# Patient Record
Sex: Male | Born: 1945
Health system: Southern US, Community
[De-identification: ages and names within clinical notes are randomized; demographics above are authoritative.]

## PROBLEM LIST (undated history)

## (undated) DIAGNOSIS — T7840XA Allergy, unspecified, initial encounter: Secondary | ICD-10-CM

## (undated) DIAGNOSIS — R32 Unspecified urinary incontinence: Secondary | ICD-10-CM

## (undated) DIAGNOSIS — E785 Hyperlipidemia, unspecified: Secondary | ICD-10-CM

## (undated) DIAGNOSIS — Z2821 Immunization not carried out because of patient refusal: Secondary | ICD-10-CM

## (undated) DIAGNOSIS — E119 Type 2 diabetes mellitus without complications: Secondary | ICD-10-CM

## (undated) HISTORY — DX: Immunization not carried out because of patient refusal: Z28.21

## (undated) HISTORY — DX: Hyperlipidemia, unspecified: E78.5

## (undated) HISTORY — DX: Type 2 diabetes mellitus without complications: E11.9

## (undated) HISTORY — DX: Unspecified urinary incontinence: R32

## (undated) HISTORY — DX: Allergy, unspecified, initial encounter: T78.40XA

---

## 1994-06-08 HISTORY — PX: HUMERUS FRACTURE SURGERY: SHX670

## 2005-05-08 ENCOUNTER — Ambulatory Visit (HOSPITAL_BASED_OUTPATIENT_CLINIC_OR_DEPARTMENT_OTHER): Admission: RE | Admit: 2005-05-08 | Discharge: 2005-05-08 | Payer: Self-pay | Admitting: Surgery

## 2005-05-08 ENCOUNTER — Ambulatory Visit (HOSPITAL_COMMUNITY): Admission: RE | Admit: 2005-05-08 | Discharge: 2005-05-08 | Payer: Self-pay | Admitting: Surgery

## 2013-03-09 HISTORY — PX: WRIST SURGERY: SHX841

## 2016-07-03 DIAGNOSIS — S0086XA Insect bite (nonvenomous) of other part of head, initial encounter: Secondary | ICD-10-CM | POA: Diagnosis not present

## 2016-07-03 DIAGNOSIS — S0006XA Insect bite (nonvenomous) of scalp, initial encounter: Secondary | ICD-10-CM | POA: Diagnosis not present

## 2016-07-03 DIAGNOSIS — L089 Local infection of the skin and subcutaneous tissue, unspecified: Secondary | ICD-10-CM | POA: Diagnosis not present

## 2016-07-03 DIAGNOSIS — S1096XA Insect bite of unspecified part of neck, initial encounter: Secondary | ICD-10-CM | POA: Diagnosis not present

## 2016-07-03 DIAGNOSIS — W57XXXA Bitten or stung by nonvenomous insect and other nonvenomous arthropods, initial encounter: Secondary | ICD-10-CM | POA: Diagnosis not present

## 2017-05-02 DIAGNOSIS — E782 Mixed hyperlipidemia: Secondary | ICD-10-CM | POA: Diagnosis not present

## 2017-05-02 DIAGNOSIS — R351 Nocturia: Secondary | ICD-10-CM | POA: Diagnosis not present

## 2017-05-02 DIAGNOSIS — E1165 Type 2 diabetes mellitus with hyperglycemia: Secondary | ICD-10-CM | POA: Diagnosis not present

## 2017-05-02 DIAGNOSIS — E118 Type 2 diabetes mellitus with unspecified complications: Secondary | ICD-10-CM | POA: Diagnosis not present

## 2017-05-02 DIAGNOSIS — R2689 Other abnormalities of gait and mobility: Secondary | ICD-10-CM | POA: Diagnosis not present

## 2017-05-16 DIAGNOSIS — IMO0001 Reserved for inherently not codable concepts without codable children: Secondary | ICD-10-CM | POA: Insufficient documentation

## 2017-05-16 DIAGNOSIS — E1165 Type 2 diabetes mellitus with hyperglycemia: Principal | ICD-10-CM

## 2017-06-19 DIAGNOSIS — R079 Chest pain, unspecified: Secondary | ICD-10-CM | POA: Diagnosis not present

## 2017-06-19 DIAGNOSIS — Z8669 Personal history of other diseases of the nervous system and sense organs: Secondary | ICD-10-CM | POA: Diagnosis not present

## 2017-06-19 DIAGNOSIS — E1165 Type 2 diabetes mellitus with hyperglycemia: Secondary | ICD-10-CM | POA: Diagnosis not present

## 2017-06-19 DIAGNOSIS — F0781 Postconcussional syndrome: Secondary | ICD-10-CM | POA: Diagnosis not present

## 2017-06-19 DIAGNOSIS — M542 Cervicalgia: Secondary | ICD-10-CM | POA: Diagnosis not present

## 2017-06-19 DIAGNOSIS — Z8739 Personal history of other diseases of the musculoskeletal system and connective tissue: Secondary | ICD-10-CM | POA: Diagnosis not present

## 2017-06-19 DIAGNOSIS — E782 Mixed hyperlipidemia: Secondary | ICD-10-CM | POA: Diagnosis not present

## 2017-06-19 DIAGNOSIS — L989 Disorder of the skin and subcutaneous tissue, unspecified: Secondary | ICD-10-CM | POA: Diagnosis not present

## 2017-06-21 DIAGNOSIS — Z8739 Personal history of other diseases of the musculoskeletal system and connective tissue: Secondary | ICD-10-CM | POA: Insufficient documentation

## 2017-06-21 DIAGNOSIS — M542 Cervicalgia: Secondary | ICD-10-CM | POA: Insufficient documentation

## 2017-06-21 DIAGNOSIS — E782 Mixed hyperlipidemia: Secondary | ICD-10-CM | POA: Insufficient documentation

## 2017-06-21 DIAGNOSIS — Z8669 Personal history of other diseases of the nervous system and sense organs: Secondary | ICD-10-CM | POA: Insufficient documentation

## 2017-06-21 DIAGNOSIS — F0781 Postconcussional syndrome: Secondary | ICD-10-CM | POA: Insufficient documentation

## 2018-04-03 ENCOUNTER — Encounter: Payer: Self-pay | Admitting: Family Medicine

## 2018-04-03 ENCOUNTER — Ambulatory Visit (INDEPENDENT_AMBULATORY_CARE_PROVIDER_SITE_OTHER): Payer: Medicare Other | Admitting: Family Medicine

## 2018-04-03 VITALS — BP 130/70 | HR 93 | Temp 98.0°F | Ht 72.0 in | Wt 217.8 lb

## 2018-04-03 DIAGNOSIS — S069X1S Unspecified intracranial injury with loss of consciousness of 30 minutes or less, sequela: Secondary | ICD-10-CM | POA: Diagnosis not present

## 2018-04-03 DIAGNOSIS — IMO0001 Reserved for inherently not codable concepts without codable children: Secondary | ICD-10-CM

## 2018-04-03 DIAGNOSIS — H9113 Presbycusis, bilateral: Secondary | ICD-10-CM

## 2018-04-03 DIAGNOSIS — E1165 Type 2 diabetes mellitus with hyperglycemia: Secondary | ICD-10-CM

## 2018-04-03 DIAGNOSIS — S069X9A Unspecified intracranial injury with loss of consciousness of unspecified duration, initial encounter: Secondary | ICD-10-CM | POA: Insufficient documentation

## 2018-04-03 DIAGNOSIS — Z2821 Immunization not carried out because of patient refusal: Secondary | ICD-10-CM | POA: Diagnosis not present

## 2018-04-03 DIAGNOSIS — E782 Mixed hyperlipidemia: Secondary | ICD-10-CM | POA: Diagnosis not present

## 2018-04-03 DIAGNOSIS — S069XAA Unspecified intracranial injury with loss of consciousness status unknown, initial encounter: Secondary | ICD-10-CM | POA: Insufficient documentation

## 2018-04-03 DIAGNOSIS — B353 Tinea pedis: Secondary | ICD-10-CM | POA: Diagnosis not present

## 2018-04-03 DIAGNOSIS — H9190 Unspecified hearing loss, unspecified ear: Secondary | ICD-10-CM | POA: Insufficient documentation

## 2018-04-03 HISTORY — DX: Immunization not carried out because of patient refusal: Z28.21

## 2018-04-03 LAB — POCT URINALYSIS DIPSTICK
Bilirubin, UA: NEGATIVE
LEUKOCYTES UA: NEGATIVE
Nitrite, UA: NEGATIVE
Protein, UA: 15
RBC UA: NEGATIVE
Spec Grav, UA: 1.025 (ref 1.010–1.025)
Urobilinogen, UA: 0.2 E.U./dL
pH, UA: 5.5 (ref 5.0–8.0)

## 2018-04-03 LAB — MICROALBUMIN / CREATININE URINE RATIO
Creatinine,U: 86.7 mg/dL
MICROALB UR: 6.1 mg/dL — AB (ref 0.0–1.9)
Microalb Creat Ratio: 7 mg/g (ref 0.0–30.0)

## 2018-04-03 LAB — CBC WITH DIFFERENTIAL/PLATELET
Basophils Absolute: 0.1 10*3/uL (ref 0.0–0.1)
Basophils Relative: 0.7 % (ref 0.0–3.0)
EOS PCT: 1.5 % (ref 0.0–5.0)
Eosinophils Absolute: 0.1 10*3/uL (ref 0.0–0.7)
HCT: 45.9 % (ref 39.0–52.0)
Hemoglobin: 15.6 g/dL (ref 13.0–17.0)
LYMPHS ABS: 2.2 10*3/uL (ref 0.7–4.0)
Lymphocytes Relative: 26.6 % (ref 12.0–46.0)
MCHC: 33.9 g/dL (ref 30.0–36.0)
MCV: 91.3 fl (ref 78.0–100.0)
MONO ABS: 0.7 10*3/uL (ref 0.1–1.0)
MONOS PCT: 8.2 % (ref 3.0–12.0)
NEUTROS PCT: 63 % (ref 43.0–77.0)
Neutro Abs: 5.2 10*3/uL (ref 1.4–7.7)
Platelets: 171 10*3/uL (ref 150.0–400.0)
RBC: 5.03 Mil/uL (ref 4.22–5.81)
RDW: 14.1 % (ref 11.5–15.5)
WBC: 8.3 10*3/uL (ref 4.0–10.5)

## 2018-04-03 LAB — COMPREHENSIVE METABOLIC PANEL
ALK PHOS: 97 U/L (ref 39–117)
ALT: 27 U/L (ref 0–53)
AST: 17 U/L (ref 0–37)
Albumin: 4.2 g/dL (ref 3.5–5.2)
BILIRUBIN TOTAL: 0.6 mg/dL (ref 0.2–1.2)
BUN: 20 mg/dL (ref 6–23)
CALCIUM: 9.7 mg/dL (ref 8.4–10.5)
CO2: 24 mEq/L (ref 19–32)
Chloride: 97 mEq/L (ref 96–112)
Creatinine, Ser: 0.97 mg/dL (ref 0.40–1.50)
GFR: 81.24 mL/min (ref >60.00)
GLUCOSE: 366 mg/dL — AB (ref 70–99)
POTASSIUM: 4.7 meq/L (ref 3.5–5.1)
Sodium: 136 mEq/L (ref 135–145)
TOTAL PROTEIN: 7.4 g/dL (ref 6.0–8.3)

## 2018-04-03 LAB — LIPID PANEL
Cholesterol: 262 mg/dL — ABNORMAL HIGH (ref 0–200)
HDL: 30.4 mg/dL — AB (ref >39.00)
NonHDL: 231.69
TRIGLYCERIDES: 330 mg/dL — AB (ref 0.0–149.0)
Total CHOL/HDL Ratio: 9
VLDL: 66 mg/dL — AB (ref 0.0–40.0)

## 2018-04-03 LAB — LDL CHOLESTEROL, DIRECT: Direct LDL: 164 mg/dL

## 2018-04-03 LAB — POCT GLYCOSYLATED HEMOGLOBIN (HGB A1C): HEMOGLOBIN A1C: 14.2

## 2018-04-03 LAB — TSH: TSH: 1.66 u[IU]/mL (ref 0.35–4.50)

## 2018-04-03 MED ORDER — ASPIRIN EC 81 MG PO TBEC: 81.0000 mg | DELAYED_RELEASE_TABLET | Freq: Every day | ORAL | Status: DC

## 2018-04-03 MED ORDER — ASPIRIN 325 MG PO TABS: 325.0000 mg | ORAL_TABLET | Freq: Every day | ORAL | 3 refills | Status: DC | PRN

## 2018-04-03 MED ORDER — SIMVASTATIN 20 MG PO TABS: 20.0000 mg | ORAL_TABLET | Freq: Every day | ORAL | 3 refills | Status: DC

## 2018-04-03 MED ORDER — LISINOPRIL 5 MG PO TABS: 5.0000 mg | ORAL_TABLET | Freq: Every day | ORAL | 3 refills | Status: DC

## 2018-04-03 MED ORDER — METFORMIN HCL 500 MG PO TABS: 1000.0000 mg | ORAL_TABLET | Freq: Two times a day (BID) | ORAL | 3 refills | Status: DC

## 2018-04-03 NOTE — Patient Instructions (Addendum)
Please return in 4-6 weeks for recheck. We will call you with your lab results and I will add an additional medication for you diabetes at that time.   It was a pleasure meeting you today! Thank you for choosing [place] to meet your healthcare needs! I truly look forward to working with you. If you have any questions or concerns, please send me a message via Mychart or call the office at [phone].   Diabetes Mellitus and Nutrition When you have diabetes (diabetes mellitus), it is very important to have healthy eating habits because your blood sugar (glucose) levels are greatly affected by what you eat and drink. Eating healthy foods in the appropriate amounts, at about the same times every day, can help you:  Control your blood glucose.  Lower your risk of heart disease.  Improve your blood pressure.  Reach or maintain a healthy weight.  Every person with diabetes is different, and each person has different needs for a meal plan. Your health care provider may recommend that you work with a diet and nutrition specialist (dietitian) to make a meal plan that is best for you. Your meal plan may vary depending on factors such as:  The calories you need.  The medicines you take.  Your weight.  Your blood glucose, blood pressure, and cholesterol levels.  Your activity level.  Other health conditions you have, such as heart or kidney disease.  How do carbohydrates affect me? Carbohydrates affect your blood glucose level more than any other type of food. Eating carbohydrates naturally increases the amount of glucose in your blood. Carbohydrate counting is a method for keeping track of how many carbohydrates you eat. Counting carbohydrates is important to keep your blood glucose at a healthy level, especially if you use insulin or take certain oral diabetes medicines. It is important to know how many carbohydrates you can safely have in each meal. This is different for every person. Your  dietitian can help you calculate how many carbohydrates you should have at each meal and for snack. Foods that contain carbohydrates include:  Bread, cereal, rice, pasta, and crackers.  Potatoes and corn.  Peas, beans, and lentils.  Milk and yogurt.  Fruit and juice.  Desserts, such as cakes, cookies, ice cream, and candy.  How does alcohol affect me? Alcohol can cause a sudden decrease in blood glucose (hypoglycemia), especially if you use insulin or take certain oral diabetes medicines. Hypoglycemia can be a life-threatening condition. Symptoms of hypoglycemia (sleepiness, dizziness, and confusion) are similar to symptoms of having too much alcohol. If your health care provider says that alcohol is safe for you, follow these guidelines:  Limit alcohol intake to no more than 1 drink per day for nonpregnant women and 2 drinks per day for men. One drink equals 12 oz of beer, 5 oz of wine, or 1 oz of hard liquor.  Do not drink on an empty stomach.  Keep yourself hydrated with water, diet soda, or unsweetened iced tea.  Keep in mind that regular soda, juice, and other mixers may contain a lot of sugar and must be counted as carbohydrates.  What are tips for following this plan? Reading food labels  Start by checking the serving size on the label. The amount of calories, carbohydrates, fats, and other nutrients listed on the label are based on one serving of the food. Many foods contain more than one serving per package.  Check the total grams (g) of carbohydrates in one serving. You can  calculate the number of servings of carbohydrates in one serving by dividing the total carbohydrates by 15. For example, if a food has 30 g of total carbohydrates, it would be equal to 2 servings of carbohydrates.  Check the number of grams (g) of saturated and trans fats in one serving. Choose foods that have low or no amount of these fats.  Check the number of milligrams (mg) of sodium in one  serving. Most people should limit total sodium intake to less than 2,300 mg per day.  Always check the nutrition information of foods labeled as "low-fat" or "nonfat". These foods may be higher in added sugar or refined carbohydrates and should be avoided.  Talk to your dietitian to identify your daily goals for nutrients listed on the label. Shopping  Avoid buying canned, premade, or processed foods. These foods tend to be high in fat, sodium, and added sugar.  Shop around the outside edge of the grocery store. This includes fresh fruits and vegetables, bulk grains, fresh meats, and fresh dairy. Cooking  Use low-heat cooking methods, such as baking, instead of high-heat cooking methods like deep frying.  Cook using healthy oils, such as olive, canola, or sunflower oil.  Avoid cooking with butter, cream, or high-fat meats. Meal planning  Eat meals and snacks regularly, preferably at the same times every day. Avoid going long periods of time without eating.  Eat foods high in fiber, such as fresh fruits, vegetables, beans, and whole grains. Talk to your dietitian about how many servings of carbohydrates you can eat at each meal.  Eat 4-6 ounces of lean protein each day, such as lean meat, chicken, fish, eggs, or tofu. 1 ounce is equal to 1 ounce of meat, chicken, or fish, 1 egg, or 1/4 cup of tofu.  Eat some foods each day that contain healthy fats, such as avocado, nuts, seeds, and fish. Lifestyle   Check your blood glucose regularly.  Exercise at least 30 minutes 5 or more days each week, or as told by your health care provider.  Take medicines as told by your health care provider.  Do not use any products that contain nicotine or tobacco, such as cigarettes and e-cigarettes. If you need help quitting, ask your health care provider.  Work with a Social worker or diabetes educator to identify strategies to manage stress and any emotional and social challenges. What are some  questions to ask my health care provider?  Do I need to meet with a diabetes educator?  Do I need to meet with a dietitian?  What number can I call if I have questions?  When are the best times to check my blood glucose? Where to find more information:  American Diabetes Association: diabetes.org/food-and-fitness/food  Academy of Nutrition and Dietetics: PokerClues.dk  [employer] of Diabetes and Digestive and Kidney Diseases (NIH): ContactWire.be Summary  A healthy meal plan will help you control your blood glucose and maintain a healthy lifestyle.  Working with a diet and nutrition specialist (dietitian) can help you make a meal plan that is best for you.  Keep in mind that carbohydrates and alcohol have immediate effects on your blood glucose levels. It is important to count carbohydrates and to use alcohol carefully. This information is not intended to replace advice given to you by your health care provider. Make sure you discuss any questions you have with your health care provider. Document Released: 08/22/2005 Document Revised: 12/30/2016 Document Reviewed: 12/30/2016 Elsevier Interactive Patient Education  [employer].  Type 2 Diabetes Mellitus, Self Care, Adult When you have type 2 diabetes (type 2 diabetes mellitus), you must keep your blood sugar (glucose) under control. You can do this with:  Nutrition.  Exercise.  Lifestyle changes.  Medicines or insulin, if needed.  Support from your doctors and others.  How do I manage my blood sugar?  Check your blood sugar level every day, as often as told.  Call your doctor if your blood sugar is above your goal numbers for 2 tests in a row.  Have your A1c (hemoglobin A1c) level checked at least two times a year. Have it checked more often if your doctor tells you to. Your doctor  will set treatment goals for you. Generally, you should have these blood sugar levels:  Before meals (preprandial): 80-130 mg/dL (4.4-7.2 mmol/L).  After meals (postprandial): lower than 180 mg/dL (10 mmol/L).  A1c level: less than 7%.  What do I need to know about high blood sugar? High blood sugar is called hyperglycemia. Know the signs of high blood sugar. Signs may include:  Feeling: ? Thirsty. ? Hungry. ? Very tired.  Needing to pee (urinate) more than usual.  Blurry vision.  What do I need to know about low blood sugar? Low blood sugar is called hypoglycemia. This is when blood sugar is at or below 70 mg/dL (3.9 mmol/L). Symptoms may include:  Feeling: ? Hungry. ? Worried or nervous (anxious). ? Sweaty and clammy. ? Confused. ? Dizzy. ? Sleepy. ? Sick to your stomach (nauseous).  Having: ? A fast heartbeat (palpitations). ? A headache. ? A change in your vision. ? Jerky movements that you cannot control (seizure). ? Nightmares. ? Tingling or no feeling (numbness) around the mouth, lips, or tongue.  Having trouble with: ? Talking. ? Paying attention (concentrating). ? Moving (coordination). ? Sleeping.  Shaking.  Passing out (fainting).  Getting upset easily (irritability).  Treating low blood sugar  To treat low blood sugar, eat or drink something sugary right away. If you can think clearly and swallow safely, follow the 15:15 rule:  Take 15 grams of a fast-acting carb (carbohydrate). Some fast-acting carbs are: ? 1 tube of glucose gel. ? 3 sugar tablets (glucose pills). ? 6-8 pieces of hard candy. ? 4 oz (120 mL) of fruit juice. ? 4 oz (120 mL) regular (not diet) soda.  Check your blood sugar 15 minutes after you take the carb.  If your blood sugar is still at or below 70 mg/dL (3.9 mmol/L), take 15 grams of a carb again.  If your blood sugar does not go above 70 mg/dL (3.9 mmol/L) after 3 tries, get help right away.  After your blood sugar  goes back to normal, eat a meal or a snack within 1 hour.  Treating very low blood sugar If your blood sugar is at or below 54 mg/dL (3 mmol/L), you have very low blood sugar (severe hypoglycemia). This is an emergency. Do not wait to see if the symptoms will go away. Get medical help right away. Call your local emergency services (911 in the [place] Do not drive yourself to the hospital. If you have very low blood sugar and you cannot eat or drink, you may need a glucagon shot (injection). A family member or friend should learn how to check your blood sugar and how to give you a glucagon shot. Ask your doctor if you need to have a glucagon shot kit at home. What else is important to manage my diabetes?  Medicine Follow these instructions about insulin and diabetes medicines:  Take them as told by your doctor.  Adjust them as told by your doctor.  Do not run out of them.  Having diabetes can raise your risk for other long-term conditions. These include heart or kidney disease. Your doctor may prescribe medicines to help prevent problems from diabetes. Food   Make healthy food choices. These include: ? Chicken, fish, egg whites, and beans. ? Oats, whole wheat, bulgur, brown rice, quinoa, and millet. ? Fresh fruits and vegetables. ? Low-fat dairy products. ? Nuts, avocado, olive oil, and canola oil.  Make a food plan with a specialist (dietitian).  Follow instructions from your doctor about what you cannot eat or drink.  Drink enough fluid to keep your pee (urine) clear or pale yellow.  Eat healthy snacks between healthy meals.  Keep track of carbs that you eat. Read food labels. Learn food serving sizes.  Follow your sick day plan when you cannot eat or drink normally. Make this plan with your doctor so it is ready to use. Activity  Exercise at least 3 times a week.  Do not go more than 2 days without exercising.  Talk with your doctor before you start a new exercise. Your  doctor may need to adjust your insulin, medicines, or food. Lifestyle   Do not use any tobacco products. These include cigarettes, chewing tobacco, and e-cigarettes.If you need help quitting, ask your doctor.  Ask your doctor how much alcohol is safe for you.  Learn to deal with stress. If you need help with this, ask your doctor. Body care  Stay up to date with your shots (immunizations).  Have your eyes and feet checked by a doctor as often as told.  Check your skin and feet every day. Check for cuts, bruises, redness, blisters, or sores.  Brush your teeth and gums two times a day.  Floss at least one time a day.  Go to the dentist least one time every 6 months.  Stay at a healthy weight. General instructions   Take over-the-counter and prescription medicines only as told by your doctor.  Share your diabetes care plan with: ? Your work or school. ? People you live with.  Check your pee (urine) for ketones: ? When you are sick. ? As told by your doctor.  Carry a card or wear jewelry that says that you have diabetes.  Ask your doctor: ? Do I need to meet with a diabetes educator? ? Where can I find a support group for people with diabetes?  Keep all follow-up visits as told by your doctor. This is important. Where to find more information: To learn more about diabetes, visit:  American Diabetes Association: www.diabetes.org  American Association of Diabetes Educators: www.diabeteseducator.org/patient-resources  This information is not intended to replace advice given to you by your health care provider. Make sure you discuss any questions you have with your health care provider. Document Released: 03/18/2016 Document Revised: 05/02/2016 Document Reviewed: 12/29/2015 Elsevier Interactive Patient Education  [employer].
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
## 2018-04-06 MED ORDER — EMPAGLIFLOZIN 10 MG PO TABS: 10.0000 mg | ORAL_TABLET | Freq: Every day | ORAL | 5 refills | Status: DC

## 2018-04-06 NOTE — Addendum Note (Signed)
Addended by: Billey Chang on: 04/06/2018 11:12 AM   Modules accepted: Orders

## 2018-04-06 NOTE — Progress Notes (Signed)
Please call patient: I have reviewed his/her lab results. Lab results show an uncontrolled cholesterol level as well as the diabetes that we discussed. We started medication for both at the office visit. Work on a diabetic diet, drinking plenty of water, and f/u with me as scheduled. 04/27/2018

## 2018-04-07 ENCOUNTER — Other Ambulatory Visit: Payer: Self-pay | Admitting: Emergency Medicine

## 2018-04-13 ENCOUNTER — Telehealth: Payer: Self-pay | Admitting: Family Medicine

## 2018-04-13 NOTE — Telephone Encounter (Signed)
Pt came in to state that the new medication prescribed to him to take in conjunction with the metformin is far to expensive for his fixed income. He was told by that pharmacy that the prescription would be $530 for a 30-day supply. Patient states that he cannot afford it when he lives on a $900-a-month fixed income. Would like to have something prescribed that is in the $8-10 range instead. Requested a call back to confirm.

## 2018-04-14 MED ORDER — GLIMEPIRIDE 4 MG PO TABS: 8.0000 mg | ORAL_TABLET | Freq: Every day | ORAL | 5 refills | Status: DC

## 2018-04-14 NOTE — Addendum Note (Signed)
Addended by: Billey Chang on: 04/14/2018 12:01 PM   Modules accepted: Orders

## 2018-04-14 NOTE — Telephone Encounter (Signed)
Called patient and let him know that the Glimepiride has been sent to his pharmacy.

## 2018-04-14 NOTE — Telephone Encounter (Signed)
Called patient and he stated that his Medicare covers Glimepiride and it is $4 per 30 day supply. Patient stated that he had been out of his medication for a period of time before he came in for his visit with [place]. Patient states that he is only taking the Metformin for now. Patient would like to take the Glimepiride since it is affordable.  Please advise.

## 2018-04-27 ENCOUNTER — Encounter: Payer: Self-pay | Admitting: Family Medicine

## 2018-04-27 ENCOUNTER — Other Ambulatory Visit: Payer: Self-pay

## 2018-04-27 ENCOUNTER — Ambulatory Visit (INDEPENDENT_AMBULATORY_CARE_PROVIDER_SITE_OTHER): Payer: Medicare Other | Admitting: Family Medicine

## 2018-04-27 VITALS — BP 132/80 | HR 102 | Temp 98.3°F | Ht 72.0 in | Wt 219.8 lb

## 2018-04-27 DIAGNOSIS — E782 Mixed hyperlipidemia: Secondary | ICD-10-CM

## 2018-04-27 DIAGNOSIS — E1165 Type 2 diabetes mellitus with hyperglycemia: Secondary | ICD-10-CM | POA: Diagnosis not present

## 2018-04-27 DIAGNOSIS — IMO0001 Reserved for inherently not codable concepts without codable children: Secondary | ICD-10-CM

## 2018-04-27 LAB — POCT CBG (FASTING - GLUCOSE)-MANUAL ENTRY: Glucose Fasting, POC: 201 mg/dL — AB (ref 70–99)

## 2018-04-27 MED ORDER — SITAGLIPTIN PHOSPHATE 100 MG PO TABS: 100.0000 mg | ORAL_TABLET | Freq: Every day | ORAL | 5 refills | Status: DC

## 2018-04-27 NOTE — Patient Instructions (Addendum)
Follow up in 6-8 weeks for Diabetes & Cholesterol  STOP the Glimepiride - after a few says if the sleepiness is better, start the new medication that was sent in today.    We will call you with information regarding your referral appointment for the Nutrition and Diabetes Management.    Call us if you have any questions or concerns.
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
## 2018-04-29 ENCOUNTER — Telehealth: Payer: Self-pay | Admitting: Family Medicine

## 2018-04-29 NOTE — Telephone Encounter (Signed)
Patient came in today to complain about price of Januvia precrisption. States that base price for 30 count 100mg  tabs is $510.69, even with GoodRx coupon it it over $400.  Patient is on a fixed income of $900 a month, wants medication that costs less. Requesting call back to confirm.

## 2018-04-30 MED ORDER — DAPAGLIFLOZIN PROPANEDIOL 10 MG PO TABS: 10.0000 mg | ORAL_TABLET | Freq: Every day | ORAL | 0 refills | Status: DC

## 2018-04-30 NOTE — Telephone Encounter (Signed)
Prescription sent in for Fargixa 10mg , Patient assistance paperwork started. Tried to call patient but VM is not set up yet. I will call  Back today.

## 2018-04-30 NOTE — Telephone Encounter (Signed)
Please start patient assistance program for diabetes medication: Order: farxiga 25 daily; we have coupons for free month to get him started.  Then apply for pt assitance.  Notify pt.  Thanks, Dr. Jonni Sanger

## 2018-04-30 NOTE — Telephone Encounter (Signed)
Per Jason Yoder Patient's phone is messed up, I left a message on his daughters voicemail to have patient contact us.  Doloris Hall,  LPN

## 2018-04-30 NOTE — Telephone Encounter (Signed)
Wilder Glade only comes in 5mg  or 10mg , which dose do you prefer?   Thanks   .Doloris Hall,  LPN

## 2018-04-30 NOTE — Telephone Encounter (Signed)
10 mg daily

## 2018-05-01 NOTE — Telephone Encounter (Signed)
LM on Daughter, Carrie's Voicemail today. Advised to call us back and ask to speak with Amilyah Nack

## 2018-05-01 NOTE — Telephone Encounter (Signed)
Patient came to office, picked up coupon for free month and Financial  form  assistance for [place]

## 2018-05-18 ENCOUNTER — Encounter: Payer: Self-pay | Admitting: Skilled Nursing Facility1

## 2018-05-18 ENCOUNTER — Encounter: Payer: Medicare Other | Attending: Family Medicine | Admitting: Skilled Nursing Facility1

## 2018-05-18 DIAGNOSIS — E119 Type 2 diabetes mellitus without complications: Secondary | ICD-10-CM

## 2018-05-18 DIAGNOSIS — Z713 Dietary counseling and surveillance: Secondary | ICD-10-CM | POA: Diagnosis not present

## 2018-05-18 NOTE — Patient Instructions (Signed)
-  Always bring your meter with you everywhere you go -Always Properly dispose of your needles:  -Discard in a hard plastic/metal container with a lid (something the needle can't puncture)  -Write Do Not Recycle on the outside of the container  -Example: A laundry detergent bottle -Never use the same needle more than once -Eat 2-3 carbohydrate choices for each meal and 1 for each snack -A meal: carbohydrates, protein, vegetable -A snack: A Fruit OR Vegetable AND Protein  -Try to be more active -Always pay attention to your body keeping watchful of possible low blood sugar (below 70) or high blood sugar (above 200)  -Check your feet every day looking for anything that was not there the day before   -check your blood sugar at least 1 time a day: either before you eat anything in the morning or 2 hours after a meal: aiming for 80-130 before eating the morning and under 180 2 hours after you have eaten

## 2018-05-18 NOTE — Progress Notes (Signed)
Diabetes Self-Management Education  Visit Type: First/Initial  05/18/2018  Jason Yoder, identified by name and date of birth, is a 72 y.o. male with a diagnosis of Diabetes: Type 2.   ASSESSMENT  Height 6' (1.829 m), weight 214 lb (97.1 kg). Body mass index is 29.02 kg/m.  Pt states his Wife passed away 3 years ago. Pt states he would like to weigh about 175 pounds. Pt arrived with his granddaughter. Pt is hard of hearing and requires glasses for print.   Diabetes Self-Management Education - 05/18/18 1405      Visit Information   Visit Type  First/Initial      Initial Visit   Diabetes Type  Type 2    Are you currently following a meal plan?  No    Are you taking your medications as prescribed?  Yes    Date Diagnosed  2014      Health Coping   How would you rate your overall health?  Fair      Psychosocial Assessment   Patient Belief/Attitude about Diabetes  Motivated to manage diabetes    Self-management support  Family    Other persons present  Family Member      Pre-Education Assessment   Patient understands the diabetes disease and treatment process.  Needs Instruction    Patient understands incorporating nutritional management into lifestyle.  Needs Instruction    Patient undertands incorporating physical activity into lifestyle.  Needs Instruction    Patient understands using medications safely.  Needs Instruction    Patient understands monitoring blood glucose, interpreting and using results  Needs Instruction    Patient understands prevention, detection, and treatment of acute complications.  Needs Instruction    Patient understands prevention, detection, and treatment of chronic complications.  Needs Instruction    Patient understands how to develop strategies to address psychosocial issues.  Needs Instruction    Patient understands how to develop strategies to promote health/change behavior.  Needs Instruction      Complications   Last HgB A1C per  patient/outside source  14.2 %    Have you had a dilated eye exam in the past 12 months?  No    Have you had a dental exam in the past 12 months?  No    Are you checking your feet?  Yes    How many days per week are you checking your feet?  7      Dietary Intake   Breakfast  scrambled eggs with low sodium [place] bacon or     Lunch  deli [place] with lettuce     Snack (afternoon)  fruit    Dinner  chickena nd trukey thighs with broccoli    Snack (evening)  strawberry and nutella      Exercise   Exercise Type  ADL's    How many days per week to you exercise?  0    How many minutes per day do you exercise?  0    Total minutes per week of exercise  0      Patient Education   Previous Diabetes Education  No    Disease state   Factors that contribute to the development of diabetes    Nutrition management   Role of diet in the treatment of diabetes and the relationship between the three main macronutrients and blood glucose level;Carbohydrate counting;Food label reading, portion sizes and measuring food.;Meal timing in regards to the patients' current diabetes medication.    Physical activity and exercise  Role of exercise on diabetes management, blood pressure control and cardiac health.;Identified with patient nutritional and/or medication changes necessary with exercise.;Helped patient identify appropriate exercises in relation to his/her diabetes, diabetes complications and other health issue.    Monitoring  Taught/evaluated SMBG meter.;Purpose and frequency of SMBG.;Yearly dilated eye exam;Daily foot exams;Identified appropriate SMBG and/or A1C goals.    Acute complications  Taught treatment of hypoglycemia - the 15 rule.;Discussed and identified patients' treatment of hyperglycemia.    Chronic complications  Assessed and discussed foot care and prevention of foot problems;Dental care    Psychosocial adjustment  Role of stress on diabetes      Individualized Goals (developed by patient)    Nutrition  General guidelines for healthy choices and portions discussed;Adjust meds/carbs with exercise as discussed    Physical Activity  Exercise 5-7 days per week;30 minutes per day    Medications  take my medication as prescribed    Monitoring   test my blood glucose as discussed;test blood glucose pre and post meals as discussed      Post-Education Assessment   Patient understands the diabetes disease and treatment process.  Demonstrates understanding / competency    Patient understands incorporating nutritional management into lifestyle.  Demonstrates understanding / competency    Patient undertands incorporating physical activity into lifestyle.  Demonstrates understanding / competency    Patient understands using medications safely.  Demonstrates understanding / competency    Patient understands monitoring blood glucose, interpreting and using results  Demonstrates understanding / competency    Patient understands prevention, detection, and treatment of acute complications.  Demonstrates understanding / competency    Patient understands prevention, detection, and treatment of chronic complications.  Demonstrates understanding / competency    Patient understands how to develop strategies to address psychosocial issues.  Demonstrates understanding / competency    Patient understands how to develop strategies to promote health/change behavior.  Demonstrates understanding / competency      Outcomes   Expected Outcomes  Demonstrated interest in learning. Expect positive outcomes    Future DMSE  PRN    Program Status  Completed       Individualized Plan for Diabetes Self-Management Training:   Learning Objective:  Patient will have a greater understanding of diabetes self-management. Patient education plan is to attend individual and/or group sessions per assessed needs and concerns.   Plan:   Patient Instructions  -Always bring your meter with you everywhere you go -Always  Properly dispose of your needles:  -Discard in a hard plastic/metal container with a lid (something the needle can't puncture)  -Write Do Not Recycle on the outside of the container  -Example: A laundry detergent bottle -Never use the same needle more than once -Eat 2-3 carbohydrate choices for each meal and 1 for each snack -A meal: carbohydrates, protein, vegetable -A snack: A Fruit OR Vegetable AND Protein  -Try to be more active -Always pay attention to your body keeping watchful of possible low blood sugar (below 70) or high blood sugar (above 200)  -Check your feet every day looking for anything that was not there the day before   -check your blood sugar at least 1 time a day: either before you eat anything in the morning or 2 hours after a meal: aiming for 80-130 before eating the morning and under 180 2 hours after you have eaten   Expected Outcomes:  Demonstrated interest in learning. Expect positive outcomes  Education material provided: Meal plan card, My Plate, Snack sheet  and Support group flyer  If problems or questions, patient to contact team via:  Phone  Future DSME appointment: PRN

## 2018-05-28 DIAGNOSIS — H2513 Age-related nuclear cataract, bilateral: Secondary | ICD-10-CM | POA: Diagnosis not present

## 2018-05-28 DIAGNOSIS — E119 Type 2 diabetes mellitus without complications: Secondary | ICD-10-CM | POA: Diagnosis not present

## 2018-05-28 LAB — HM DIABETES EYE EXAM

## 2018-06-01 ENCOUNTER — Telehealth: Payer: Self-pay | Admitting: Family Medicine

## 2018-06-01 NOTE — Telephone Encounter (Signed)
Yes, please continue farxiga. His polyuria will improve only when his diabetes improves.  Continue to be strict with diet, working with the nutritionist and his current medications.   Please refill and have him apply for patient assistance.

## 2018-06-01 NOTE — Telephone Encounter (Signed)
Last OV 04/27/18, Next OV 06/08/18  Farxiga last filled 04/30/18, #30 with 0 refills  Please see message and advise

## 2018-06-01 NOTE — Telephone Encounter (Signed)
Patient states that he is currently out of the [place] and he took his last pill today and he stated that he will take his Metformin until he can get his Wilder Glade through patient assistance due to the current cost. Patient verbalized that he felt better taking the [place]. Patient is coming by today to have his paperwork faxed for patient assistance.  FYI

## 2018-06-01 NOTE — Telephone Encounter (Signed)
Pt came in today to ask about the medication combination that he is taking. He states that on the Farxiga and the Metformin he feels much better but that he is having to urinate every 15 minutes on average, he finds this agrivating. States that he has the forms for the Patient Assistance program filled out but not sent in, waiting to see if Dr. Jonni Sanger wants to keep him on the Farxiga. Requesting a call back.

## 2018-06-02 NOTE — Telephone Encounter (Signed)
Noted. Will address medications at f/u visit 06/08/2018.

## 2018-06-08 ENCOUNTER — Ambulatory Visit (INDEPENDENT_AMBULATORY_CARE_PROVIDER_SITE_OTHER): Payer: Medicare Other | Admitting: Family Medicine

## 2018-06-08 ENCOUNTER — Encounter: Payer: Self-pay | Admitting: Family Medicine

## 2018-06-08 ENCOUNTER — Other Ambulatory Visit: Payer: Self-pay

## 2018-06-08 VITALS — BP 138/82 | HR 98 | Temp 97.7°F | Ht 72.0 in | Wt 214.4 lb

## 2018-06-08 DIAGNOSIS — E1165 Type 2 diabetes mellitus with hyperglycemia: Secondary | ICD-10-CM | POA: Diagnosis not present

## 2018-06-08 DIAGNOSIS — H903 Sensorineural hearing loss, bilateral: Secondary | ICD-10-CM | POA: Diagnosis not present

## 2018-06-08 DIAGNOSIS — IMO0001 Reserved for inherently not codable concepts without codable children: Secondary | ICD-10-CM

## 2018-06-08 DIAGNOSIS — R3915 Urgency of urination: Secondary | ICD-10-CM | POA: Diagnosis not present

## 2018-06-08 DIAGNOSIS — E782 Mixed hyperlipidemia: Secondary | ICD-10-CM

## 2018-06-08 LAB — POCT GLYCOSYLATED HEMOGLOBIN (HGB A1C): Hemoglobin A1C: 9.4 % — AB (ref 4.0–5.6)
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
## 2018-06-08 NOTE — Patient Instructions (Signed)
Please return in 3 months to recheck your diabetes.  Your diabetes control is improved.  We need to get you back on the Farxiga and I'd like to start Januvia. We will call you with information on a financial assistance program for that.   We will call you with information regarding your referral appointment. Urology. If you do not hear from us within the next 2 weeks, please let me know. It can take 1-2 weeks to get appointments set up with the specialists.    If you have any questions or concerns, please don't hesitate to send me a message via MyChart or call the office at [phone]. Thank you for visiting with us today! It's our pleasure caring for you.    Type 2 Diabetes Mellitus, Self Care, Adult When you have type 2 diabetes (type 2 diabetes mellitus), you must keep your blood sugar (glucose) under control. You can do this with:  Nutrition.  Exercise.  Lifestyle changes.  Medicines or insulin, if needed.  Support from your doctors and others.  How do I manage my blood sugar?  Check your blood sugar level every day, as often as told.  Call your doctor if your blood sugar is above your goal numbers for 2 tests in a row.  Have your A1c (hemoglobin A1c) level checked at least two times a year. Have it checked more often if your doctor tells you to. Your doctor will set treatment goals for you. Generally, you should have these blood sugar levels:  Before meals (preprandial): 80-130 mg/dL (4.4-7.2 mmol/L).  After meals (postprandial): lower than 180 mg/dL (10 mmol/L).  A1c level: less than 7%.  What do I need to know about high blood sugar? High blood sugar is called hyperglycemia. Know the signs of high blood sugar. Signs may include:  Feeling: ? Thirsty. ? Hungry. ? Very tired.  Needing to pee (urinate) more than usual.  Blurry vision.  What do I need to know about low blood sugar? Low blood sugar is called hypoglycemia. This is when blood sugar is at or below  70 mg/dL (3.9 mmol/L). Symptoms may include:  Feeling: ? Hungry. ? Worried or nervous (anxious). ? Sweaty and clammy. ? Confused. ? Dizzy. ? Sleepy. ? Sick to your stomach (nauseous).  Having: ? A fast heartbeat (palpitations). ? A headache. ? A change in your vision. ? Jerky movements that you cannot control (seizure). ? Nightmares. ? Tingling or no feeling (numbness) around the mouth, lips, or tongue.  Having trouble with: ? Talking. ? Paying attention (concentrating). ? Moving (coordination). ? Sleeping.  Shaking.  Passing out (fainting).  Getting upset easily (irritability).  Treating low blood sugar  To treat low blood sugar, eat or drink something sugary right away. If you can think clearly and swallow safely, follow the 15:15 rule:  Take 15 grams of a fast-acting carb (carbohydrate). Some fast-acting carbs are: ? 1 tube of glucose gel. ? 3 sugar tablets (glucose pills). ? 6-8 pieces of hard candy. ? 4 oz (120 mL) of fruit juice. ? 4 oz (120 mL) regular (not diet) soda.  Check your blood sugar 15 minutes after you take the carb.  If your blood sugar is still at or below 70 mg/dL (3.9 mmol/L), take 15 grams of a carb again.  If your blood sugar does not go above 70 mg/dL (3.9 mmol/L) after 3 tries, get help right away.  After your blood sugar goes back to normal, eat a meal or a snack within   1 hour.  Treating very low blood sugar If your blood sugar is at or below 54 mg/dL (3 mmol/L), you have very low blood sugar (severe hypoglycemia). This is an emergency. Do not wait to see if the symptoms will go away. Get medical help right away. Call your local emergency services (911 in the [place] Do not drive yourself to the hospital. If you have very low blood sugar and you cannot eat or drink, you may need a glucagon shot (injection). A family member or friend should learn how to check your blood sugar and how to give you a glucagon shot. Ask your doctor if you need  to have a glucagon shot kit at home. What else is important to manage my diabetes? Medicine Follow these instructions about insulin and diabetes medicines:  Take them as told by your doctor.  Adjust them as told by your doctor.  Do not run out of them.  Having diabetes can raise your risk for other long-term conditions. These include heart or kidney disease. Your doctor may prescribe medicines to help prevent problems from diabetes. Food   Make healthy food choices. These include: ? Chicken, fish, egg whites, and beans. ? Oats, whole wheat, bulgur, brown rice, quinoa, and millet. ? Fresh fruits and vegetables. ? Low-fat dairy products. ? Nuts, avocado, olive oil, and canola oil.  Make a food plan with a specialist (dietitian).  Follow instructions from your doctor about what you cannot eat or drink.  Drink enough fluid to keep your pee (urine) clear or pale yellow.  Eat healthy snacks between healthy meals.  Keep track of carbs that you eat. Read food labels. Learn food serving sizes.  Follow your sick day plan when you cannot eat or drink normally. Make this plan with your doctor so it is ready to use. Activity  Exercise at least 3 times a week.  Do not go more than 2 days without exercising.  Talk with your doctor before you start a new exercise. Your doctor may need to adjust your insulin, medicines, or food. Lifestyle   Do not use any tobacco products. These include cigarettes, chewing tobacco, and e-cigarettes.If you need help quitting, ask your doctor.  Ask your doctor how much alcohol is safe for you.  Learn to deal with stress. If you need help with this, ask your doctor. Body care  Stay up to date with your shots (immunizations).  Have your eyes and feet checked by a doctor as often as told.  Check your skin and feet every day. Check for cuts, bruises, redness, blisters, or sores.  Brush your teeth and gums two times a day.  Floss at least one time a  day.  Go to the dentist least one time every 6 months.  Stay at a healthy weight. General instructions   Take over-the-counter and prescription medicines only as told by your doctor.  Share your diabetes care plan with: ? Your work or school. ? People you live with.  Check your pee (urine) for ketones: ? When you are sick. ? As told by your doctor.  Carry a card or wear jewelry that says that you have diabetes.  Ask your doctor: ? Do I need to meet with a diabetes educator? ? Where can I find a support group for people with diabetes?  Keep all follow-up visits as told by your doctor. This is important. Where to find more information: To learn more about diabetes, visit:  American Diabetes Association: www.diabetes.org  American   Association of Diabetes Educators: www.diabeteseducator.org/patient-resources  This information is not intended to replace advice given to you by your health care provider. Make sure you discuss any questions you have with your health care provider. Document Released: 03/18/2016 Document Revised: 05/02/2016 Document Reviewed: 12/29/2015 Elsevier Interactive Patient Education  2018 Elsevier Inc.   

## 2018-06-15 ENCOUNTER — Telehealth: Payer: Self-pay | Admitting: Emergency Medicine

## 2018-06-15 NOTE — Telephone Encounter (Signed)
Patient samples for Wilder Glade given to patient. Discussed with Patient to take one tablet per day. Patient verbalized understanding.   LOT: [id number] Exp: 12/2020   Doloris Hall,  LPN

## 2018-06-30 ENCOUNTER — Telehealth: Payer: Self-pay | Admitting: Emergency Medicine

## 2018-06-30 MED ORDER — SIMVASTATIN 20 MG PO TABS: 20.0000 mg | ORAL_TABLET | Freq: Every day | ORAL | 3 refills | Status: DC

## 2018-06-30 MED ORDER — LISINOPRIL 5 MG PO TABS: 5.0000 mg | ORAL_TABLET | Freq: Every day | ORAL | 3 refills | Status: DC

## 2018-06-30 NOTE — Telephone Encounter (Signed)
Patient came by the office and stated that he never heard from [facility] Patient Assistance, I spoke with [employer] and his application was on hold but I provided additional information to get Application Approved. Application has been approved until 06/30/2019.  Patient also needs prescriptions for Lisinopril & Simvastatin, I have refilled these prescriptions and patient has been informed.   Doloris Hall,  LPN

## 2018-07-14 NOTE — Telephone Encounter (Signed)
Spoke with [employer] Patient assistance, states order was mailed out on 07/07/2018, should be receiving in the next few days, Patient informed that we will call him when medication arrives in the office. Patient verbalized understanding.   Doloris Hall,  LPN

## 2018-07-14 NOTE — Telephone Encounter (Signed)
Pt called in to make Amy aware that his Januvia has still not been received. Pt would like to be advise Oneida Arenas further.    CB: [phone]

## 2018-08-27 DIAGNOSIS — R3912 Poor urinary stream: Secondary | ICD-10-CM | POA: Diagnosis not present

## 2018-08-27 DIAGNOSIS — R3915 Urgency of urination: Secondary | ICD-10-CM | POA: Diagnosis not present

## 2018-08-27 DIAGNOSIS — R350 Frequency of micturition: Secondary | ICD-10-CM | POA: Diagnosis not present

## 2018-08-27 DIAGNOSIS — R351 Nocturia: Secondary | ICD-10-CM | POA: Diagnosis not present

## 2018-08-27 DIAGNOSIS — N401 Enlarged prostate with lower urinary tract symptoms: Secondary | ICD-10-CM | POA: Diagnosis not present

## 2018-08-27 DIAGNOSIS — R3914 Feeling of incomplete bladder emptying: Secondary | ICD-10-CM | POA: Diagnosis not present

## 2018-09-04 ENCOUNTER — Other Ambulatory Visit: Payer: Self-pay

## 2018-09-04 ENCOUNTER — Encounter: Payer: Self-pay | Admitting: Family Medicine

## 2018-09-04 ENCOUNTER — Ambulatory Visit (INDEPENDENT_AMBULATORY_CARE_PROVIDER_SITE_OTHER): Payer: Medicare Other | Admitting: Family Medicine

## 2018-09-04 VITALS — BP 120/88 | HR 89 | Temp 97.6°F | Ht 72.0 in | Wt 218.8 lb

## 2018-09-04 DIAGNOSIS — S069X1S Unspecified intracranial injury with loss of consciousness of 30 minutes or less, sequela: Secondary | ICD-10-CM | POA: Diagnosis not present

## 2018-09-04 DIAGNOSIS — H9113 Presbycusis, bilateral: Secondary | ICD-10-CM

## 2018-09-04 DIAGNOSIS — E782 Mixed hyperlipidemia: Secondary | ICD-10-CM | POA: Diagnosis not present

## 2018-09-04 DIAGNOSIS — E1165 Type 2 diabetes mellitus with hyperglycemia: Secondary | ICD-10-CM | POA: Diagnosis not present

## 2018-09-04 DIAGNOSIS — IMO0001 Reserved for inherently not codable concepts without codable children: Secondary | ICD-10-CM

## 2018-09-04 DIAGNOSIS — Z1211 Encounter for screening for malignant neoplasm of colon: Secondary | ICD-10-CM

## 2018-09-04 LAB — POCT GLYCOSYLATED HEMOGLOBIN (HGB A1C): Hemoglobin A1C: 10 % — AB (ref 4.0–5.6)
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
## 2018-09-04 NOTE — Patient Instructions (Addendum)
Please return in 3 months for your annual complete physical; please come fasting.  Please return for a flu shot.    We will call you with information regarding your referral appointment. Dr. Francetta Found - a diabetes specialist to help get your diabetes under control. I believe you will need to take more medication and he can help with this.  If you do not hear from [place] within the next 2 weeks, please let me know. It can take 1-2 weeks to get appointments set up with the specialists.   You will be receiving the Cologuard test kit soon. Please follow instructions and send it back in. Call them with questions.   If you have any questions or concerns, please don't hesitate to send me a message via MyChart or call the office at [phone]. Thank you for visiting with [place] today! It's our pleasure caring for you.   Diabetes Mellitus and Nutrition When you have diabetes (diabetes mellitus), it is very important to have healthy eating habits because your blood sugar (glucose) levels are greatly affected by what you eat and drink. Eating healthy foods in the appropriate amounts, at about the same times every day, can help you:  Control your blood glucose.  Lower your risk of heart disease.  Improve your blood pressure.  Reach or maintain a healthy weight.  Every person with diabetes is different, and each person has different needs for a meal plan. Your health care provider may recommend that you work with a diet and nutrition specialist (dietitian) to make a meal plan that is best for you. Your meal plan may vary depending on factors such as:  The calories you need.  The medicines you take.  Your weight.  Your blood glucose, blood pressure, and cholesterol levels.  Your activity level.  Other health conditions you have, such as heart or kidney disease.  How do carbohydrates affect me? Carbohydrates affect your blood glucose level more than any other type of food. Eating carbohydrates  naturally increases the amount of glucose in your blood. Carbohydrate counting is a method for keeping track of how many carbohydrates you eat. Counting carbohydrates is important to keep your blood glucose at a healthy level, especially if you use insulin or take certain oral diabetes medicines. It is important to know how many carbohydrates you can safely have in each meal. This is different for every person. Your dietitian can help you calculate how many carbohydrates you should have at each meal and for snack. Foods that contain carbohydrates include:  Bread, cereal, rice, pasta, and crackers.  Potatoes and corn.  Peas, beans, and lentils.  Milk and yogurt.  Fruit and juice.  Desserts, such as cakes, cookies, ice cream, and candy.  How does alcohol affect me? Alcohol can cause a sudden decrease in blood glucose (hypoglycemia), especially if you use insulin or take certain oral diabetes medicines. Hypoglycemia can be a life-threatening condition. Symptoms of hypoglycemia (sleepiness, dizziness, and confusion) are similar to symptoms of having too much alcohol. If your health care provider says that alcohol is safe for you, follow these guidelines:  Limit alcohol intake to no more than 1 drink per day for nonpregnant women and 2 drinks per day for men. One drink equals 12 oz of beer, 5 oz of wine, or 1 oz of hard liquor.  Do not drink on an empty stomach.  Keep yourself hydrated with water, diet soda, or unsweetened iced tea.  Keep in mind that regular soda, juice, and  other mixers may contain a lot of sugar and must be counted as carbohydrates.  What are tips for following this plan? Reading food labels  Start by checking the serving size on the label. The amount of calories, carbohydrates, fats, and other nutrients listed on the label are based on one serving of the food. Many foods contain more than one serving per package.  Check the total grams (g) of carbohydrates in one  serving. You can calculate the number of servings of carbohydrates in one serving by dividing the total carbohydrates by 15. For example, if a food has 30 g of total carbohydrates, it would be equal to 2 servings of carbohydrates.  Check the number of grams (g) of saturated and trans fats in one serving. Choose foods that have low or no amount of these fats.  Check the number of milligrams (mg) of sodium in one serving. Most people should limit total sodium intake to less than 2,300 mg per day.  Always check the nutrition information of foods labeled as "low-fat" or "nonfat". These foods may be higher in added sugar or refined carbohydrates and should be avoided.  Talk to your dietitian to identify your daily goals for nutrients listed on the label. Shopping  Avoid buying canned, premade, or processed foods. These foods tend to be high in fat, sodium, and added sugar.  Shop around the outside edge of the grocery store. This includes fresh fruits and vegetables, bulk grains, fresh meats, and fresh dairy. Cooking  Use low-heat cooking methods, such as baking, instead of high-heat cooking methods like deep frying.  Cook using healthy oils, such as olive, canola, or sunflower oil.  Avoid cooking with butter, cream, or high-fat meats. Meal planning  Eat meals and snacks regularly, preferably at the same times every day. Avoid going long periods of time without eating.  Eat foods high in fiber, such as fresh fruits, vegetables, beans, and whole grains. Talk to your dietitian about how many servings of carbohydrates you can eat at each meal.  Eat 4-6 ounces of lean protein each day, such as lean meat, chicken, fish, eggs, or tofu. 1 ounce is equal to 1 ounce of meat, chicken, or fish, 1 egg, or 1/4 cup of tofu.  Eat some foods each day that contain healthy fats, such as avocado, nuts, seeds, and fish. Lifestyle   Check your blood glucose regularly.  Exercise at least 30 minutes 5 or more  days each week, or as told by your health care provider.  Take medicines as told by your health care provider.  Do not use any products that contain nicotine or tobacco, such as cigarettes and e-cigarettes. If you need help quitting, ask your health care provider.  Work with a Social worker or diabetes educator to identify strategies to manage stress and any emotional and social challenges. What are some questions to ask my health care provider?  Do I need to meet with a diabetes educator?  Do I need to meet with a dietitian?  What number can I call if I have questions?  When are the best times to check my blood glucose? Where to find more information:  American Diabetes Association: diabetes.org/food-and-fitness/food  Academy of Nutrition and Dietetics: PokerClues.dk  [employer] of Diabetes and Digestive and Kidney Diseases (NIH): ContactWire.be Summary  A healthy meal plan will help you control your blood glucose and maintain a healthy lifestyle.  Working with a diet and nutrition specialist (dietitian) can help you make a meal plan  that is best for you.  Keep in mind that carbohydrates and alcohol have immediate effects on your blood glucose levels. It is important to count carbohydrates and to use alcohol carefully. This information is not intended to replace advice given to you by your health care provider. Make sure you discuss any questions you have with your health care provider. Document Released: 08/22/2005 Document Revised: 12/30/2016 Document Reviewed: 12/30/2016 Elsevier Interactive Patient Education  [employer].

## 2018-09-07 ENCOUNTER — Ambulatory Visit: Payer: Medicare Other | Admitting: Family Medicine

## 2018-09-08 ENCOUNTER — Ambulatory Visit: Payer: Medicare Other | Admitting: Family Medicine

## 2018-09-14 DIAGNOSIS — M9901 Segmental and somatic dysfunction of cervical region: Secondary | ICD-10-CM | POA: Diagnosis not present

## 2018-09-14 DIAGNOSIS — M5412 Radiculopathy, cervical region: Secondary | ICD-10-CM | POA: Diagnosis not present

## 2018-09-14 DIAGNOSIS — M5136 Other intervertebral disc degeneration, lumbar region: Secondary | ICD-10-CM | POA: Diagnosis not present

## 2018-09-14 DIAGNOSIS — M9903 Segmental and somatic dysfunction of lumbar region: Secondary | ICD-10-CM | POA: Diagnosis not present

## 2018-09-16 DIAGNOSIS — M9901 Segmental and somatic dysfunction of cervical region: Secondary | ICD-10-CM | POA: Diagnosis not present

## 2018-09-16 DIAGNOSIS — M5412 Radiculopathy, cervical region: Secondary | ICD-10-CM | POA: Diagnosis not present

## 2018-09-16 DIAGNOSIS — M9903 Segmental and somatic dysfunction of lumbar region: Secondary | ICD-10-CM | POA: Diagnosis not present

## 2018-09-16 DIAGNOSIS — M5136 Other intervertebral disc degeneration, lumbar region: Secondary | ICD-10-CM | POA: Diagnosis not present

## 2018-09-17 DIAGNOSIS — M9901 Segmental and somatic dysfunction of cervical region: Secondary | ICD-10-CM | POA: Diagnosis not present

## 2018-09-17 DIAGNOSIS — M9903 Segmental and somatic dysfunction of lumbar region: Secondary | ICD-10-CM | POA: Diagnosis not present

## 2018-09-17 DIAGNOSIS — M5412 Radiculopathy, cervical region: Secondary | ICD-10-CM | POA: Diagnosis not present

## 2018-09-17 DIAGNOSIS — M5136 Other intervertebral disc degeneration, lumbar region: Secondary | ICD-10-CM | POA: Diagnosis not present

## 2018-09-21 DIAGNOSIS — M5136 Other intervertebral disc degeneration, lumbar region: Secondary | ICD-10-CM | POA: Diagnosis not present

## 2018-09-21 DIAGNOSIS — M9903 Segmental and somatic dysfunction of lumbar region: Secondary | ICD-10-CM | POA: Diagnosis not present

## 2018-09-21 DIAGNOSIS — M5412 Radiculopathy, cervical region: Secondary | ICD-10-CM | POA: Diagnosis not present

## 2018-09-21 DIAGNOSIS — M9901 Segmental and somatic dysfunction of cervical region: Secondary | ICD-10-CM | POA: Diagnosis not present

## 2018-09-23 DIAGNOSIS — M5136 Other intervertebral disc degeneration, lumbar region: Secondary | ICD-10-CM | POA: Diagnosis not present

## 2018-09-23 DIAGNOSIS — M9901 Segmental and somatic dysfunction of cervical region: Secondary | ICD-10-CM | POA: Diagnosis not present

## 2018-09-23 DIAGNOSIS — M5412 Radiculopathy, cervical region: Secondary | ICD-10-CM | POA: Diagnosis not present

## 2018-09-23 DIAGNOSIS — M9903 Segmental and somatic dysfunction of lumbar region: Secondary | ICD-10-CM | POA: Diagnosis not present

## 2018-09-28 DIAGNOSIS — M9901 Segmental and somatic dysfunction of cervical region: Secondary | ICD-10-CM | POA: Diagnosis not present

## 2018-09-28 DIAGNOSIS — M9903 Segmental and somatic dysfunction of lumbar region: Secondary | ICD-10-CM | POA: Diagnosis not present

## 2018-09-28 DIAGNOSIS — M5412 Radiculopathy, cervical region: Secondary | ICD-10-CM | POA: Diagnosis not present

## 2018-09-28 DIAGNOSIS — M5136 Other intervertebral disc degeneration, lumbar region: Secondary | ICD-10-CM | POA: Diagnosis not present

## 2018-09-30 DIAGNOSIS — E1169 Type 2 diabetes mellitus with other specified complication: Secondary | ICD-10-CM | POA: Diagnosis not present

## 2018-09-30 DIAGNOSIS — E1140 Type 2 diabetes mellitus with diabetic neuropathy, unspecified: Secondary | ICD-10-CM | POA: Diagnosis not present

## 2018-09-30 DIAGNOSIS — Z794 Long term (current) use of insulin: Secondary | ICD-10-CM | POA: Diagnosis not present

## 2018-09-30 DIAGNOSIS — E1165 Type 2 diabetes mellitus with hyperglycemia: Secondary | ICD-10-CM | POA: Diagnosis not present

## 2018-09-30 DIAGNOSIS — E1159 Type 2 diabetes mellitus with other circulatory complications: Secondary | ICD-10-CM | POA: Diagnosis not present

## 2018-09-30 DIAGNOSIS — E785 Hyperlipidemia, unspecified: Secondary | ICD-10-CM | POA: Diagnosis not present

## 2018-09-30 DIAGNOSIS — I10 Essential (primary) hypertension: Secondary | ICD-10-CM | POA: Diagnosis not present

## 2018-10-01 DIAGNOSIS — M9903 Segmental and somatic dysfunction of lumbar region: Secondary | ICD-10-CM | POA: Diagnosis not present

## 2018-10-01 DIAGNOSIS — M5412 Radiculopathy, cervical region: Secondary | ICD-10-CM | POA: Diagnosis not present

## 2018-10-01 DIAGNOSIS — M9901 Segmental and somatic dysfunction of cervical region: Secondary | ICD-10-CM | POA: Diagnosis not present

## 2018-10-01 DIAGNOSIS — M5136 Other intervertebral disc degeneration, lumbar region: Secondary | ICD-10-CM | POA: Diagnosis not present

## 2018-10-05 DIAGNOSIS — M9903 Segmental and somatic dysfunction of lumbar region: Secondary | ICD-10-CM | POA: Diagnosis not present

## 2018-10-05 DIAGNOSIS — M9901 Segmental and somatic dysfunction of cervical region: Secondary | ICD-10-CM | POA: Diagnosis not present

## 2018-10-05 DIAGNOSIS — M5412 Radiculopathy, cervical region: Secondary | ICD-10-CM | POA: Diagnosis not present

## 2018-10-05 DIAGNOSIS — M5136 Other intervertebral disc degeneration, lumbar region: Secondary | ICD-10-CM | POA: Diagnosis not present

## 2018-10-22 NOTE — Progress Notes (Signed)
Attempted to contact patient, voicemail is full and unable to leave message.   Doloris Hall,  LPN

## 2018-10-28 DIAGNOSIS — M9901 Segmental and somatic dysfunction of cervical region: Secondary | ICD-10-CM | POA: Diagnosis not present

## 2018-10-28 DIAGNOSIS — M5136 Other intervertebral disc degeneration, lumbar region: Secondary | ICD-10-CM | POA: Diagnosis not present

## 2018-10-28 DIAGNOSIS — M5412 Radiculopathy, cervical region: Secondary | ICD-10-CM | POA: Diagnosis not present

## 2018-10-28 DIAGNOSIS — M9903 Segmental and somatic dysfunction of lumbar region: Secondary | ICD-10-CM | POA: Diagnosis not present

## 2018-11-24 ENCOUNTER — Telehealth: Payer: Self-pay | Admitting: Emergency Medicine

## 2018-11-24 NOTE — Telephone Encounter (Signed)
Error

## 2018-11-25 NOTE — Telephone Encounter (Addendum)
Attempted to contact patient to see if Cologuard Testing has been mailed in. Patient voicemail is full. Will try to contact at a later time.

## 2018-12-07 ENCOUNTER — Encounter: Payer: Medicare Other | Admitting: Family Medicine

## 2018-12-07 DIAGNOSIS — Z0289 Encounter for other administrative examinations: Secondary | ICD-10-CM

## 2018-12-21 DIAGNOSIS — M9901 Segmental and somatic dysfunction of cervical region: Secondary | ICD-10-CM | POA: Diagnosis not present

## 2018-12-21 DIAGNOSIS — M5412 Radiculopathy, cervical region: Secondary | ICD-10-CM | POA: Diagnosis not present

## 2018-12-21 DIAGNOSIS — M5136 Other intervertebral disc degeneration, lumbar region: Secondary | ICD-10-CM | POA: Diagnosis not present

## 2018-12-21 DIAGNOSIS — M9903 Segmental and somatic dysfunction of lumbar region: Secondary | ICD-10-CM | POA: Diagnosis not present

## 2018-12-24 DIAGNOSIS — M9903 Segmental and somatic dysfunction of lumbar region: Secondary | ICD-10-CM | POA: Diagnosis not present

## 2018-12-24 DIAGNOSIS — M5412 Radiculopathy, cervical region: Secondary | ICD-10-CM | POA: Diagnosis not present

## 2018-12-24 DIAGNOSIS — M5136 Other intervertebral disc degeneration, lumbar region: Secondary | ICD-10-CM | POA: Diagnosis not present

## 2018-12-24 DIAGNOSIS — M9901 Segmental and somatic dysfunction of cervical region: Secondary | ICD-10-CM | POA: Diagnosis not present

## 2018-12-28 DIAGNOSIS — M9901 Segmental and somatic dysfunction of cervical region: Secondary | ICD-10-CM | POA: Diagnosis not present

## 2018-12-28 DIAGNOSIS — M5412 Radiculopathy, cervical region: Secondary | ICD-10-CM | POA: Diagnosis not present

## 2018-12-28 DIAGNOSIS — M9903 Segmental and somatic dysfunction of lumbar region: Secondary | ICD-10-CM | POA: Diagnosis not present

## 2018-12-28 DIAGNOSIS — M5136 Other intervertebral disc degeneration, lumbar region: Secondary | ICD-10-CM | POA: Diagnosis not present

## 2018-12-31 DIAGNOSIS — M5412 Radiculopathy, cervical region: Secondary | ICD-10-CM | POA: Diagnosis not present

## 2018-12-31 DIAGNOSIS — M9903 Segmental and somatic dysfunction of lumbar region: Secondary | ICD-10-CM | POA: Diagnosis not present

## 2018-12-31 DIAGNOSIS — M9901 Segmental and somatic dysfunction of cervical region: Secondary | ICD-10-CM | POA: Diagnosis not present

## 2018-12-31 DIAGNOSIS — M5136 Other intervertebral disc degeneration, lumbar region: Secondary | ICD-10-CM | POA: Diagnosis not present

## 2019-04-06 DIAGNOSIS — M5136 Other intervertebral disc degeneration, lumbar region: Secondary | ICD-10-CM | POA: Diagnosis not present

## 2019-04-06 DIAGNOSIS — M9903 Segmental and somatic dysfunction of lumbar region: Secondary | ICD-10-CM | POA: Diagnosis not present

## 2019-04-06 DIAGNOSIS — M5412 Radiculopathy, cervical region: Secondary | ICD-10-CM | POA: Diagnosis not present

## 2019-04-06 DIAGNOSIS — M9901 Segmental and somatic dysfunction of cervical region: Secondary | ICD-10-CM | POA: Diagnosis not present

## 2019-04-15 DIAGNOSIS — C44319 Basal cell carcinoma of skin of other parts of face: Secondary | ICD-10-CM | POA: Diagnosis not present

## 2019-04-15 DIAGNOSIS — L02415 Cutaneous abscess of right lower limb: Secondary | ICD-10-CM | POA: Diagnosis not present

## 2019-04-15 DIAGNOSIS — C4401 Basal cell carcinoma of skin of lip: Secondary | ICD-10-CM | POA: Diagnosis not present

## 2019-04-15 DIAGNOSIS — D485 Neoplasm of uncertain behavior of skin: Secondary | ICD-10-CM | POA: Diagnosis not present

## 2019-04-15 DIAGNOSIS — L821 Other seborrheic keratosis: Secondary | ICD-10-CM | POA: Diagnosis not present

## 2019-05-11 DIAGNOSIS — M5412 Radiculopathy, cervical region: Secondary | ICD-10-CM | POA: Diagnosis not present

## 2019-05-11 DIAGNOSIS — M5136 Other intervertebral disc degeneration, lumbar region: Secondary | ICD-10-CM | POA: Diagnosis not present

## 2019-05-11 DIAGNOSIS — M9903 Segmental and somatic dysfunction of lumbar region: Secondary | ICD-10-CM | POA: Diagnosis not present

## 2019-05-11 DIAGNOSIS — M9901 Segmental and somatic dysfunction of cervical region: Secondary | ICD-10-CM | POA: Diagnosis not present

## 2019-05-17 DIAGNOSIS — M9903 Segmental and somatic dysfunction of lumbar region: Secondary | ICD-10-CM | POA: Diagnosis not present

## 2019-05-17 DIAGNOSIS — M9901 Segmental and somatic dysfunction of cervical region: Secondary | ICD-10-CM | POA: Diagnosis not present

## 2019-05-17 DIAGNOSIS — M5136 Other intervertebral disc degeneration, lumbar region: Secondary | ICD-10-CM | POA: Diagnosis not present

## 2019-05-17 DIAGNOSIS — M5412 Radiculopathy, cervical region: Secondary | ICD-10-CM | POA: Diagnosis not present

## 2019-05-26 DIAGNOSIS — M9901 Segmental and somatic dysfunction of cervical region: Secondary | ICD-10-CM | POA: Diagnosis not present

## 2019-05-26 DIAGNOSIS — M5136 Other intervertebral disc degeneration, lumbar region: Secondary | ICD-10-CM | POA: Diagnosis not present

## 2019-05-26 DIAGNOSIS — M9903 Segmental and somatic dysfunction of lumbar region: Secondary | ICD-10-CM | POA: Diagnosis not present

## 2019-05-26 DIAGNOSIS — M5412 Radiculopathy, cervical region: Secondary | ICD-10-CM | POA: Diagnosis not present

## 2019-05-27 DIAGNOSIS — M9903 Segmental and somatic dysfunction of lumbar region: Secondary | ICD-10-CM | POA: Diagnosis not present

## 2019-05-27 DIAGNOSIS — M5136 Other intervertebral disc degeneration, lumbar region: Secondary | ICD-10-CM | POA: Diagnosis not present

## 2019-05-27 DIAGNOSIS — M5412 Radiculopathy, cervical region: Secondary | ICD-10-CM | POA: Diagnosis not present

## 2019-05-27 DIAGNOSIS — M9901 Segmental and somatic dysfunction of cervical region: Secondary | ICD-10-CM | POA: Diagnosis not present

## 2019-06-10 DIAGNOSIS — M9901 Segmental and somatic dysfunction of cervical region: Secondary | ICD-10-CM | POA: Diagnosis not present

## 2019-06-10 DIAGNOSIS — M5412 Radiculopathy, cervical region: Secondary | ICD-10-CM | POA: Diagnosis not present

## 2019-06-10 DIAGNOSIS — M9903 Segmental and somatic dysfunction of lumbar region: Secondary | ICD-10-CM | POA: Diagnosis not present

## 2019-06-10 DIAGNOSIS — M5136 Other intervertebral disc degeneration, lumbar region: Secondary | ICD-10-CM | POA: Diagnosis not present

## 2019-06-14 DIAGNOSIS — M9901 Segmental and somatic dysfunction of cervical region: Secondary | ICD-10-CM | POA: Diagnosis not present

## 2019-06-14 DIAGNOSIS — M5136 Other intervertebral disc degeneration, lumbar region: Secondary | ICD-10-CM | POA: Diagnosis not present

## 2019-06-14 DIAGNOSIS — M5412 Radiculopathy, cervical region: Secondary | ICD-10-CM | POA: Diagnosis not present

## 2019-06-14 DIAGNOSIS — M9903 Segmental and somatic dysfunction of lumbar region: Secondary | ICD-10-CM | POA: Diagnosis not present

## 2019-06-16 DIAGNOSIS — M5412 Radiculopathy, cervical region: Secondary | ICD-10-CM | POA: Diagnosis not present

## 2019-06-16 DIAGNOSIS — M9901 Segmental and somatic dysfunction of cervical region: Secondary | ICD-10-CM | POA: Diagnosis not present

## 2019-06-16 DIAGNOSIS — M5136 Other intervertebral disc degeneration, lumbar region: Secondary | ICD-10-CM | POA: Diagnosis not present

## 2019-06-16 DIAGNOSIS — M9903 Segmental and somatic dysfunction of lumbar region: Secondary | ICD-10-CM | POA: Diagnosis not present

## 2019-06-17 DIAGNOSIS — M5136 Other intervertebral disc degeneration, lumbar region: Secondary | ICD-10-CM | POA: Diagnosis not present

## 2019-06-17 DIAGNOSIS — M5412 Radiculopathy, cervical region: Secondary | ICD-10-CM | POA: Diagnosis not present

## 2019-06-17 DIAGNOSIS — M9901 Segmental and somatic dysfunction of cervical region: Secondary | ICD-10-CM | POA: Diagnosis not present

## 2019-06-17 DIAGNOSIS — M9903 Segmental and somatic dysfunction of lumbar region: Secondary | ICD-10-CM | POA: Diagnosis not present

## 2019-06-23 DIAGNOSIS — M5412 Radiculopathy, cervical region: Secondary | ICD-10-CM | POA: Diagnosis not present

## 2019-06-23 DIAGNOSIS — M9903 Segmental and somatic dysfunction of lumbar region: Secondary | ICD-10-CM | POA: Diagnosis not present

## 2019-06-23 DIAGNOSIS — M5136 Other intervertebral disc degeneration, lumbar region: Secondary | ICD-10-CM | POA: Diagnosis not present

## 2019-06-23 DIAGNOSIS — M9901 Segmental and somatic dysfunction of cervical region: Secondary | ICD-10-CM | POA: Diagnosis not present

## 2019-06-24 DIAGNOSIS — M9903 Segmental and somatic dysfunction of lumbar region: Secondary | ICD-10-CM | POA: Diagnosis not present

## 2019-06-24 DIAGNOSIS — M5136 Other intervertebral disc degeneration, lumbar region: Secondary | ICD-10-CM | POA: Diagnosis not present

## 2019-06-24 DIAGNOSIS — M5412 Radiculopathy, cervical region: Secondary | ICD-10-CM | POA: Diagnosis not present

## 2019-06-24 DIAGNOSIS — M9901 Segmental and somatic dysfunction of cervical region: Secondary | ICD-10-CM | POA: Diagnosis not present

## 2019-07-09 ENCOUNTER — Other Ambulatory Visit: Payer: Self-pay

## 2019-08-31 DIAGNOSIS — E785 Hyperlipidemia, unspecified: Secondary | ICD-10-CM | POA: Diagnosis not present

## 2019-08-31 DIAGNOSIS — E1169 Type 2 diabetes mellitus with other specified complication: Secondary | ICD-10-CM | POA: Diagnosis not present

## 2019-08-31 DIAGNOSIS — E1159 Type 2 diabetes mellitus with other circulatory complications: Secondary | ICD-10-CM | POA: Diagnosis not present

## 2019-08-31 DIAGNOSIS — I10 Essential (primary) hypertension: Secondary | ICD-10-CM | POA: Diagnosis not present

## 2019-08-31 DIAGNOSIS — E1142 Type 2 diabetes mellitus with diabetic polyneuropathy: Secondary | ICD-10-CM | POA: Diagnosis not present

## 2019-09-10 DIAGNOSIS — E1142 Type 2 diabetes mellitus with diabetic polyneuropathy: Secondary | ICD-10-CM | POA: Diagnosis not present

## 2019-09-10 DIAGNOSIS — E1165 Type 2 diabetes mellitus with hyperglycemia: Secondary | ICD-10-CM | POA: Diagnosis not present

## 2019-09-10 DIAGNOSIS — Z794 Long term (current) use of insulin: Secondary | ICD-10-CM | POA: Diagnosis not present

## 2019-09-28 DIAGNOSIS — Z23 Encounter for immunization: Secondary | ICD-10-CM | POA: Diagnosis not present

## 2019-09-28 DIAGNOSIS — E1165 Type 2 diabetes mellitus with hyperglycemia: Secondary | ICD-10-CM | POA: Diagnosis not present

## 2019-09-28 DIAGNOSIS — Z794 Long term (current) use of insulin: Secondary | ICD-10-CM | POA: Diagnosis not present

## 2019-09-28 DIAGNOSIS — E782 Mixed hyperlipidemia: Secondary | ICD-10-CM | POA: Diagnosis not present

## 2019-09-29 DIAGNOSIS — E1142 Type 2 diabetes mellitus with diabetic polyneuropathy: Secondary | ICD-10-CM | POA: Diagnosis not present

## 2019-09-30 DIAGNOSIS — M9901 Segmental and somatic dysfunction of cervical region: Secondary | ICD-10-CM | POA: Diagnosis not present

## 2019-09-30 DIAGNOSIS — M5136 Other intervertebral disc degeneration, lumbar region: Secondary | ICD-10-CM | POA: Diagnosis not present

## 2019-09-30 DIAGNOSIS — M9903 Segmental and somatic dysfunction of lumbar region: Secondary | ICD-10-CM | POA: Diagnosis not present

## 2019-09-30 DIAGNOSIS — M5412 Radiculopathy, cervical region: Secondary | ICD-10-CM | POA: Diagnosis not present

## 2019-10-01 DIAGNOSIS — Z794 Long term (current) use of insulin: Secondary | ICD-10-CM | POA: Diagnosis not present

## 2019-10-01 DIAGNOSIS — E1165 Type 2 diabetes mellitus with hyperglycemia: Secondary | ICD-10-CM | POA: Diagnosis not present

## 2019-10-06 DIAGNOSIS — E1142 Type 2 diabetes mellitus with diabetic polyneuropathy: Secondary | ICD-10-CM | POA: Diagnosis not present

## 2019-10-25 DIAGNOSIS — Z794 Long term (current) use of insulin: Secondary | ICD-10-CM | POA: Diagnosis not present

## 2019-10-25 DIAGNOSIS — E162 Hypoglycemia, unspecified: Secondary | ICD-10-CM | POA: Diagnosis not present

## 2019-10-25 DIAGNOSIS — E1165 Type 2 diabetes mellitus with hyperglycemia: Secondary | ICD-10-CM | POA: Diagnosis not present

## 2019-11-01 DIAGNOSIS — E1165 Type 2 diabetes mellitus with hyperglycemia: Secondary | ICD-10-CM | POA: Diagnosis not present

## 2019-11-01 DIAGNOSIS — Z794 Long term (current) use of insulin: Secondary | ICD-10-CM | POA: Diagnosis not present

## 2019-11-03 DIAGNOSIS — N189 Chronic kidney disease, unspecified: Secondary | ICD-10-CM | POA: Diagnosis not present

## 2019-11-03 DIAGNOSIS — Z794 Long term (current) use of insulin: Secondary | ICD-10-CM | POA: Diagnosis not present

## 2019-11-03 DIAGNOSIS — E1122 Type 2 diabetes mellitus with diabetic chronic kidney disease: Secondary | ICD-10-CM | POA: Diagnosis not present

## 2019-11-03 DIAGNOSIS — E1140 Type 2 diabetes mellitus with diabetic neuropathy, unspecified: Secondary | ICD-10-CM | POA: Diagnosis not present

## 2019-11-03 DIAGNOSIS — I129 Hypertensive chronic kidney disease with stage 1 through stage 4 chronic kidney disease, or unspecified chronic kidney disease: Secondary | ICD-10-CM | POA: Diagnosis not present

## 2019-11-09 DIAGNOSIS — M9901 Segmental and somatic dysfunction of cervical region: Secondary | ICD-10-CM | POA: Diagnosis not present

## 2019-11-09 DIAGNOSIS — M5136 Other intervertebral disc degeneration, lumbar region: Secondary | ICD-10-CM | POA: Diagnosis not present

## 2019-11-09 DIAGNOSIS — M5412 Radiculopathy, cervical region: Secondary | ICD-10-CM | POA: Diagnosis not present

## 2019-11-09 DIAGNOSIS — M9903 Segmental and somatic dysfunction of lumbar region: Secondary | ICD-10-CM | POA: Diagnosis not present

## 2020-09-21 ENCOUNTER — Emergency Department (HOSPITAL_COMMUNITY): Payer: Medicare Other

## 2020-09-21 ENCOUNTER — Other Ambulatory Visit: Payer: Self-pay | Admitting: Physician Assistant

## 2020-09-21 ENCOUNTER — Other Ambulatory Visit: Payer: Self-pay

## 2020-09-21 ENCOUNTER — Emergency Department (HOSPITAL_COMMUNITY)
Admission: EM | Admit: 2020-09-21 | Discharge: 2020-09-21 | Disposition: A | Discharge status: Home / Self Care | Payer: Medicare Other | Attending: Emergency Medicine | Admitting: Emergency Medicine

## 2020-09-21 DIAGNOSIS — Z794 Long term (current) use of insulin: Secondary | ICD-10-CM | POA: Diagnosis not present

## 2020-09-21 DIAGNOSIS — Z7984 Long term (current) use of oral hypoglycemic drugs: Secondary | ICD-10-CM | POA: Diagnosis not present

## 2020-09-21 DIAGNOSIS — R531 Weakness: Secondary | ICD-10-CM

## 2020-09-21 DIAGNOSIS — N50812 Left testicular pain: Secondary | ICD-10-CM

## 2020-09-21 DIAGNOSIS — Z87891 Personal history of nicotine dependence: Secondary | ICD-10-CM | POA: Insufficient documentation

## 2020-09-21 DIAGNOSIS — E119 Type 2 diabetes mellitus without complications: Secondary | ICD-10-CM | POA: Diagnosis not present

## 2020-09-21 DIAGNOSIS — Z79899 Other long term (current) drug therapy: Secondary | ICD-10-CM | POA: Insufficient documentation

## 2020-09-21 DIAGNOSIS — R1032 Left lower quadrant pain: Secondary | ICD-10-CM

## 2020-09-21 DIAGNOSIS — N451 Epididymitis: Secondary | ICD-10-CM

## 2020-09-21 LAB — URINALYSIS, ROUTINE W REFLEX MICROSCOPIC
Bilirubin Urine: NEGATIVE
Glucose, UA: >=500 mg/dL — AB
Hgb urine dipstick: NEGATIVE
Ketones, ur: 5 mg/dL — AB
Nitrite: NEGATIVE
Protein, ur: NEGATIVE mg/dL
Specific Gravity, Urine: 1.015 (ref 1.005–1.030)
pH: 7 (ref 5.0–8.0)

## 2020-09-21 LAB — COMPREHENSIVE METABOLIC PANEL
ALT: 39 U/L (ref 0–44)
AST: 36 U/L (ref 15–41)
Albumin: 3 g/dL — ABNORMAL LOW (ref 3.5–5.0)
Alkaline Phosphatase: 132 U/L — ABNORMAL HIGH (ref 38–126)
Anion gap: 12 (ref 5–15)
BUN: 25 mg/dL — ABNORMAL HIGH (ref 8–23)
CO2: 23 mmol/L (ref 22–32)
Calcium: 9.1 mg/dL (ref 8.9–10.3)
Chloride: 96 mmol/L — ABNORMAL LOW (ref 98–111)
Creatinine, Ser: 0.98 mg/dL (ref 0.61–1.24)
GFR, Estimated: >60 mL/min (ref >60)
Glucose, Bld: 271 mg/dL — ABNORMAL HIGH (ref 70–99)
Potassium: 4.8 mmol/L (ref 3.5–5.1)
Sodium: 131 mmol/L — ABNORMAL LOW (ref 135–145)
Total Bilirubin: 1.7 mg/dL — ABNORMAL HIGH (ref 0.3–1.2)
Total Protein: 7.5 g/dL (ref 6.5–8.1)

## 2020-09-21 LAB — CBC WITH DIFFERENTIAL/PLATELET
Abs Immature Granulocytes: 0.07 10*3/uL (ref 0.00–0.07)
Basophils Absolute: 0 10*3/uL (ref 0.0–0.1)
Basophils Relative: 0 %
Eosinophils Absolute: 0.1 10*3/uL (ref 0.0–0.5)
Eosinophils Relative: 1 %
HCT: 41.3 % (ref 39.0–52.0)
Hemoglobin: 13.8 g/dL (ref 13.0–17.0)
Immature Granulocytes: 1 %
Lymphocytes Relative: 11 %
Lymphs Abs: 1 10*3/uL (ref 0.7–4.0)
MCH: 30.1 pg (ref 26.0–34.0)
MCHC: 33.4 g/dL (ref 30.0–36.0)
MCV: 90 fL (ref 80.0–100.0)
Monocytes Absolute: 0.6 10*3/uL (ref 0.1–1.0)
Monocytes Relative: 7 %
Neutro Abs: 7.6 10*3/uL (ref 1.7–7.7)
Neutrophils Relative %: 80 %
Platelets: 178 10*3/uL (ref 150–400)
RBC: 4.59 MIL/uL (ref 4.22–5.81)
RDW: 13.7 % (ref 11.5–15.5)
WBC: 9.4 10*3/uL (ref 4.0–10.5)
nRBC: 0 % (ref 0.0–0.2)

## 2020-09-21 MED ORDER — LEVOFLOXACIN 500 MG PO TABS
500.0000 mg | ORAL_TABLET | Freq: Once | ORAL | Status: AC
  Administered 2020-09-21: 500 mg via ORAL
  Filled 2020-09-21: qty 1

## 2020-09-21 MED ORDER — SODIUM CHLORIDE 0.9 % IV BOLUS
1000.0000 mL | Freq: Once | INTRAVENOUS | Status: AC
  Administered 2020-09-21: 1000 mL via INTRAVENOUS

## 2020-09-21 MED ORDER — LEVOFLOXACIN 500 MG PO TABS: 500.0000 mg | ORAL_TABLET | Freq: Every day | ORAL | 0 refills | Status: DC

## 2020-09-21 NOTE — ED Notes (Signed)
CBG 261 per glucose meter

## 2020-09-21 NOTE — ED Notes (Signed)
EDP notified of pt's successful ambulation

## 2020-09-21 NOTE — ED Provider Notes (Signed)
[facility] EMERGENCY DEPARTMENT Provider Note  CSN: [id number] Arrival date & time: 09/21/20 1139    History Chief Complaint  Patient presents with  . Weakness    HPI  Jason Yoder is a 74 y.o. male brought to the ED via EMS from PCP office for evaluation of multiple complaints. His main concern is that he is feeling very weak recently. HE reports he was very depressed over the last several months he attributes to his family's disinterest in his life. He stopped taking all of medications and was not eating well. Reports 20lb weight loss over the last 2 months. He also had a fall recently and had trouble getting up. He has had L testicular pain the last few days since the fall. His Dexcom has been reports sugars in the 300s since yesterday. Recently started back on his medications. He has had mild SOB and occasional chest pains.    Past Medical History:  Diagnosis Date  . Allergy   . Diabetes mellitus without complication ([place])   . Hyperlipidemia   . Refused pneumococcal vaccine 04/03/2018   Refuses all vaccinations  . Urine incontinence     Past Surgical History:  Procedure Laterality Date  . HUMERUS FRACTURE SURGERY  06/1994  . WRIST SURGERY  03/2013    No family history on file.  Social History   Tobacco Use  . Smoking status: Former Smoker    Packs/day: 0.50    Years: 40.00    Pack years: 20.00    Types: Cigarettes    Quit date: 12/09/2012    Years since quitting: 7.7  . Smokeless tobacco: Former Systems developer    Quit date: 12/10/1968  Vaping Use  . Vaping Use: Never used  Substance Use Topics  . Alcohol use: Never  . Drug use: Never     Home Medications Prior to Admission medications   Medication Sig Start Date End Date Taking? Authorizing Provider  HUMULIN 70/30 (70-30) 100 UNIT/ML injection Inject 12-14 Units into the skin 2 (two) times daily. 14 units in the morning 12 units at night 06/17/20  Yes [provider]  lisinopril (PRINIVIL,ZESTRIL) 5 MG  tablet Take 1 tablet (5 mg total) by mouth daily. 06/30/18  Yes Leamon Arnt, MD  metFORMIN (GLUCOPHAGE) 1000 MG tablet Take 1,000 mg by mouth 2 (two) times daily. 08/31/19  Yes [provider]  simvastatin (ZOCOR) 40 MG tablet Take 40 mg by mouth at bedtime. 04/18/20  Yes [provider]  aspirin EC 81 MG tablet Take 1 tablet (81 mg total) by mouth daily. Patient not taking: Reported on 09/21/2020 04/03/18   Leamon Arnt, MD  Blood Glucose Monitoring Suppl DEVI Use as directed twice daily 05/16/17   [provider]  dapagliflozin propanediol (FARXIGA) 10 MG TABS tablet Take 10 mg by mouth daily. Patient not taking: Reported on 09/21/2020 04/30/18   Leamon Arnt, MD  Glucose Blood (BLOOD GLUCOSE TEST STRIPS) STRP Use as instructed 05/16/17   [provider]  Lancets MISC Use as directed. 05/16/17   [provider]  metFORMIN (GLUCOPHAGE) 500 MG tablet Take 2 tablets (1,000 mg total) by mouth 2 (two) times daily with a meal. Patient not taking: Reported on 09/21/2020 04/03/18   Leamon Arnt, MD  simvastatin (ZOCOR) 20 MG tablet Take 1 tablet (20 mg total) by mouth at bedtime. Patient not taking: Reported on 09/21/2020 06/30/18   Leamon Arnt, MD     Allergies    Vancomycin  Review of Systems   Review of Systems A comprehensive review of systems was completed and negative except as noted in HPI.    Physical Exam BP 114/85 (BP Location: Left Arm)   Pulse 83   Temp 97.6 F (36.4 C) (Oral)   Resp 17   Ht 5\' 11"  (1.803 m)   SpO2 100%   BMI 30.52 kg/m   Physical Exam Vitals and nursing note reviewed.  Constitutional:      Appearance: Normal appearance.  HENT:     Head: Normocephalic and atraumatic.     Nose: Nose normal.     Mouth/Throat:     Mouth: Mucous membranes are dry.  Eyes:     Extraocular Movements: Extraocular movements intact.     Conjunctiva/sclera: Conjunctivae normal.  Cardiovascular:     Rate and Rhythm: Normal  rate.  Pulmonary:     Effort: Pulmonary effort is normal.     Breath sounds: Normal breath sounds.  Abdominal:     General: Abdomen is flat.     Palpations: Abdomen is soft.     Tenderness: There is no abdominal tenderness.  Genitourinary:    Comments: L testicle very tender to palpation Musculoskeletal:        General: No swelling. Normal range of motion.     Cervical back: Neck supple.  Skin:    General: Skin is warm and dry.  Neurological:     General: No focal deficit present.     Mental Status: He is alert.  Psychiatric:        Mood and Affect: Mood normal.      ED Results / Procedures / Treatments   Labs (all labs ordered are listed, but only abnormal results are displayed) Labs Reviewed  COMPREHENSIVE METABOLIC PANEL - Abnormal; Notable for the following components:      Result Value   Sodium 131 (*)    Chloride 96 (*)    Glucose, Bld 271 (*)    BUN 25 (*)    Albumin 3.0 (*)    Alkaline Phosphatase 132 (*)    Total Bilirubin 1.7 (*)    All other components within normal limits  URINALYSIS, ROUTINE W REFLEX MICROSCOPIC - Abnormal; Notable for the following components:   Glucose, UA >=500 (*)    Ketones, ur 5 (*)    Leukocytes,Ua TRACE (*)    Bacteria, UA MANY (*)    All other components within normal limits  URINE CULTURE  CBC WITH DIFFERENTIAL/PLATELET  CBG MONITORING, ED    EKG EKG Interpretation  Date/Time:  Thursday September 21 2020 11:59:22 EDT Ventricular Rate:  91 PR Interval:    QRS Duration: 130 QT Interval:  377 QTC Calculation: 464 R Axis:   -73 Text Interpretation: Sinus rhythm Right bundle branch block Inferior infarct, old Abnormal lateral Q waves No old tracing to compare Confirmed by Calvert Cantor [phone]) on 09/21/2020 12:56:03 PM   Radiology US Scrotum  Result Date: 09/21/2020 CLINICAL DATA:  Left testicular pain EXAM: ULTRASOUND OF SCROTUM TECHNIQUE: Complete ultrasound examination of the testicles, epididymis, and other  scrotal structures was performed. COMPARISON:  None. FINDINGS: Right testicle Measurements: 3.7 x 2.0 x 2.6 cm. Normal uniform testicular parenchyma. No mass or microlithiasis. Normal color flow. Left testicle Measurements: 3.7 x 1.8 x 2.4 cm. Normal uniform testicular parenchyma. No mass or microlithiasis. Normal color flow. Abundant paratesticular fat protruding along the inguinal canal. Right epididymis: Multiple cysts throughout the right epididymis, largest cysts in the head measures to 9  mm in 6 mm. Additional cysts the epididymal body measuring up to 9 mm as well. Left epididymis: Mildly enlarged and slightly heterogeneous appearance of the left epididymis with some questionably increased color flow. Hydrocele:  Trace bilateral hydroceles. Varicocele:  None visualized. IMPRESSION: 1. Mildly enlarged and heterogeneous appearance of the left epididymis with some questionably increased color flow. Could reflect epididymitis. 2. Multiple right epididymal cysts, largest seen in the epididymal head and body measuring up to 9 mm. 3. Trace bilateral hydroceles. 4. Abundant left inguinal and paratesticular fat. Electronically Signed   By: Lovena Le M.D.   On: 09/21/2020 15:16    Procedures Procedures  Medications Ordered in the ED Medications  levofloxacin (LEVAQUIN) tablet 500 mg (has no administration in time range)  sodium chloride 0.9 % bolus 1,000 mL (1,000 mLs Intravenous New Bag/Given 09/21/20 1530)     MDM Rules/Calculators/A&P MDM Patient with multiple complaints mostly related to recent medication noncompliance due to depression. He seems to be doing better from that standpoint, denies any SI and has started taking his medications again recently. Will check labs, [place] of scrotum. Vitals here are normal, although he was reportedly hypotensive at PCP office earlier today, given NS en route.  ED Course  I have reviewed the triage vital signs and the nursing notes.  Pertinent labs & imaging  results that were available during my care of the patient were reviewed by me and considered in my medical decision making (see chart for details).  Clinical Course as of Sep 21 1609  Thu Sep 21, 2020  1255 CBC is normal.    [CS]  1311 CMP with mild hyperglycemia, otherwise unremarkable.    [CS]  1522 [place] consistent with epididymitis, will treat with Levaquin. Awaiting UA.   [CS]  1638 Patient still feeling weak, IVF infusing now, but slow rate due to tenuous IV access. Patient will be reassessed after IVF and ambulated for dispo decision.    [CS]  4536 UA with glucose as well as RBC and WBC with bacteria. Consistent with suspected epididymitis. Treating with Levaquin as above. Culture sent.    [CS]  4680 Care of the patient signed out to Dr. Francia Greaves at the change of shift.    [CS]    Clinical Course User Index [CS] Truddie Hidden, MD    Final Clinical Impression(s) / ED Diagnoses Final diagnoses:  None    Rx / DC Orders ED Discharge Orders    None       Truddie Hidden, MD 09/21/20 1610

## 2020-09-21 NOTE — ED Provider Notes (Signed)
ED workup suggests epididymitis.  Patient is improved following his ED evaluation.  He is ambulating through the ED without difficulty.  Patient appears to be appropriate for further outpatient work-up and treatment.  Importance of close follow-up stressed.  Strict return precautions given understood.   Valarie Merino, MD 09/21/20 [phone]

## 2020-09-21 NOTE — Discharge Instructions (Signed)
Please return for any problem.  °

## 2020-09-21 NOTE — ED Triage Notes (Addendum)
Patient arriving from [facility] office via EMS complaining of weakness x 2 weeks. He was hypotensive and hyperglycemic at doctor's office,CBG 303, BP 86/50. Pt is also complaining of L testicular pain. Per doctor pt has been noncompliant with doctor's and endocrinologist appointments. 20 G RH, 500 cc bolus NS given via EMS.   EMS vitals BP 116/70 HR 102 R 18 SPO2 97% T 98.4

## 2020-09-22 NOTE — ED Notes (Signed)
Patient's prescription called into [facility] per Dr Francia Greaves

## 2020-09-23 LAB — CBG MONITORING, ED: Glucose-Capillary: 261 mg/dL — ABNORMAL HIGH (ref 70–99)

## 2020-09-24 LAB — URINE CULTURE: Culture: >=100000 — AB

## 2020-09-25 ENCOUNTER — Telehealth: Payer: Self-pay | Admitting: *Deleted

## 2020-09-25 NOTE — Telephone Encounter (Signed)
Post ED Visit - Positive Culture Follow-up  Culture report reviewed by antimicrobial stewardship pharmacist: [facility] Team []  Elenor Quinones, Pharm.D. []  Heide Guile, Pharm.D., BCPS AQ-ID []  Parks Neptune, Pharm.D., BCPS []  Alycia Rossetti, Pharm.D., BCPS []  [place], [place].D., BCPS, AAHIVP []  Legrand Como, Pharm.D., BCPS, AAHIVP []  Salome Arnt, PharmD, BCPS []  Johnnette Gourd, PharmD, BCPS []  Hughes Better, PharmD, BCPS []  Leeroy Cha, PharmD []  Laqueta Linden, PharmD, BCPS []  Albertina Parr, PharmD  [facility] []  Leodis Sias, PharmD []  Lindell Spar, PharmD []  Royetta Asal, PharmD []  Graylin Shiver, Rph []  Rema Fendt) Glennon Mac, PharmD []  Arlyn Dunning, PharmD []  Netta Cedars, PharmD []  Dia Sitter, PharmD []  Leone Haven, PharmD []  Gretta Arab, PharmD []  Theodis Shove, PharmD []  Peggyann Juba, PharmD [x]  Reuel Boom, PharmD   Positive urine culture Treated with Levofloxacin, organism sensitive to the same and no further patient follow-up is required at this time.  Harlon Flor [facility] 09/25/2020, 12:02 PM

## 2020-10-10 ENCOUNTER — Other Ambulatory Visit: Payer: Self-pay | Admitting: Physician Assistant

## 2020-10-10 DIAGNOSIS — Z122 Encounter for screening for malignant neoplasm of respiratory organs: Secondary | ICD-10-CM

## 2020-10-25 ENCOUNTER — Ambulatory Visit: Payer: Medicare Other

## 2020-11-14 ENCOUNTER — Ambulatory Visit
Admission: RE | Admit: 2020-11-14 | Discharge: 2020-11-14 | Disposition: A | Discharge status: Home / Self Care | Payer: Medicare Other | Source: Ambulatory Visit | Attending: Physician Assistant | Admitting: Physician Assistant

## 2020-11-14 ENCOUNTER — Other Ambulatory Visit: Payer: Self-pay | Admitting: Physician Assistant

## 2020-11-14 ENCOUNTER — Other Ambulatory Visit: Payer: Self-pay

## 2020-11-14 DIAGNOSIS — Z122 Encounter for screening for malignant neoplasm of respiratory organs: Secondary | ICD-10-CM

## 2020-11-30 IMAGING — US US SCROTUM
1 series · 13 of 25 positions shown · non-contrast
Comparison: None.

CLINICAL DATA: Left testicular pain

EXAM:
ULTRASOUND OF SCROTUM
TECHNIQUE: Complete ultrasound examination of the testicles, epididymis, and
other scrotal structures was performed.

[Series 1: us scrotum · 13 of 38 slices shown]
[im 1/38]
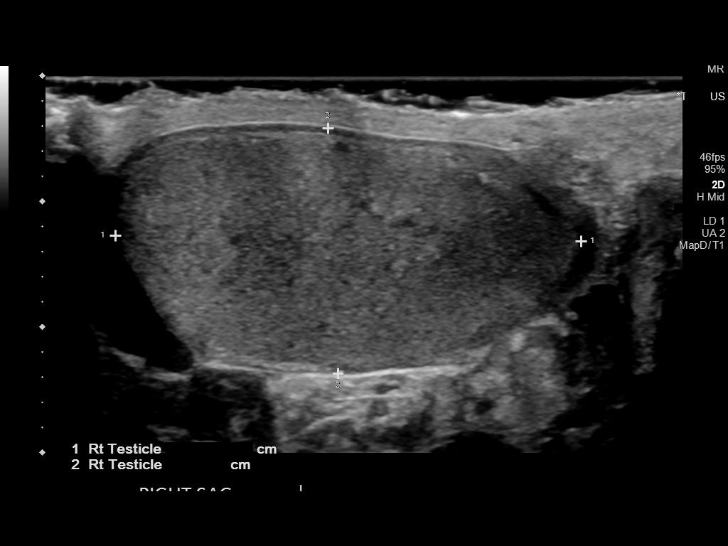
[im 4/38]
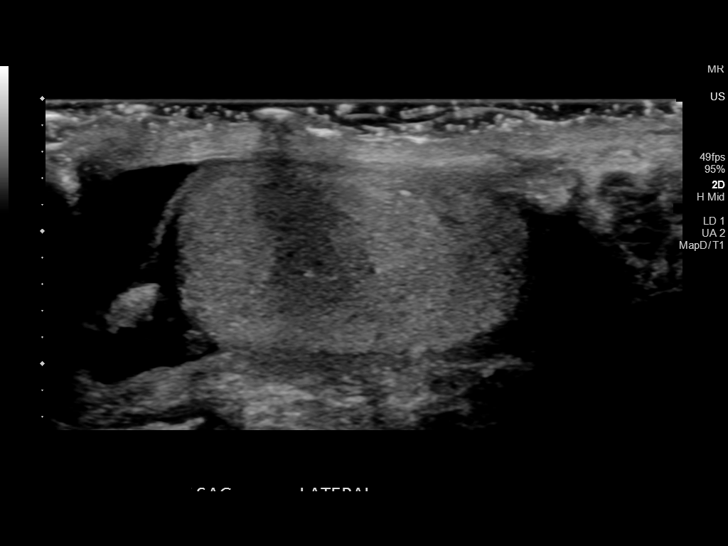
[im 7/38]
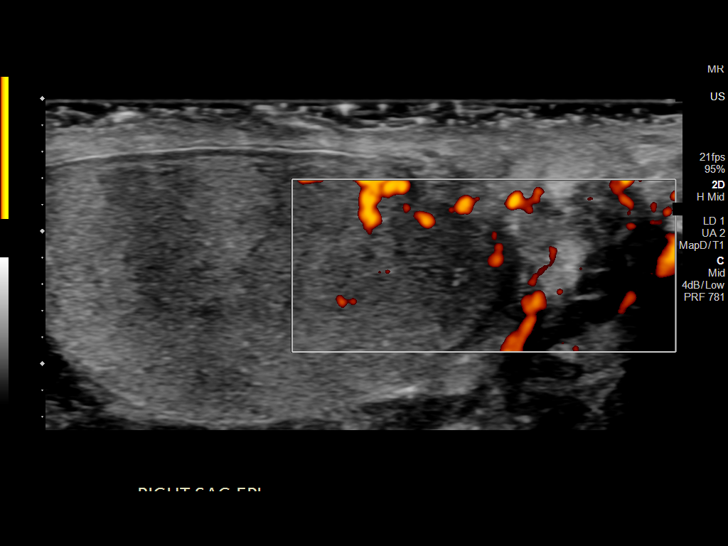
[im 10/38]
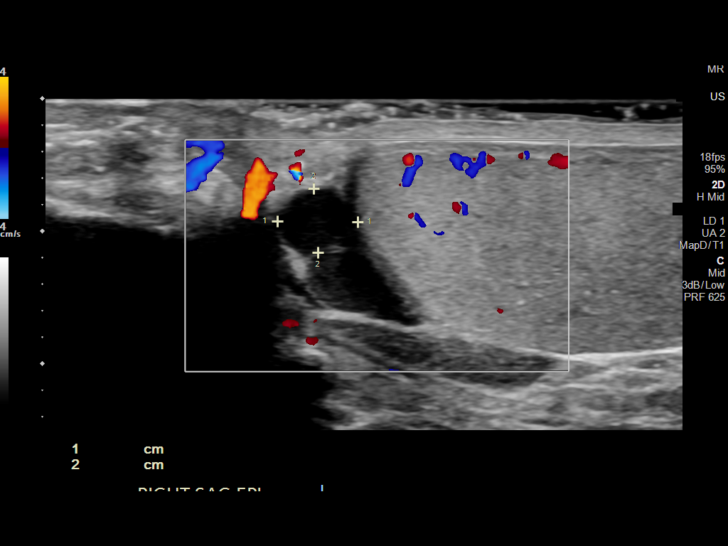
[im 13/38]
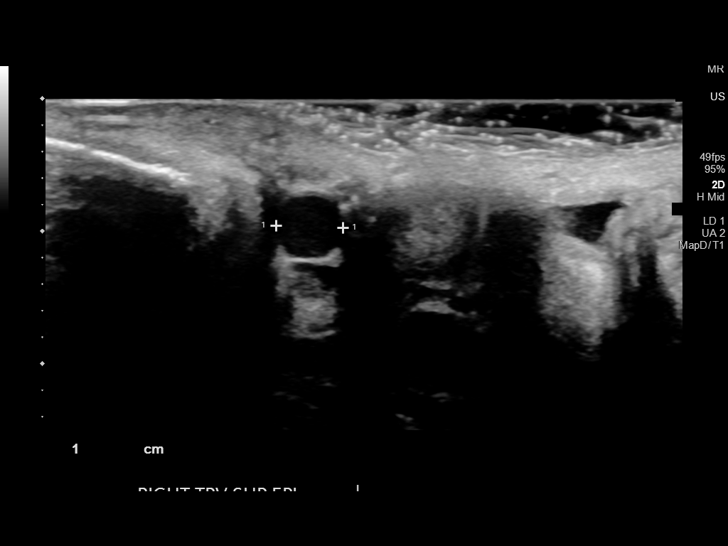
[im 16/38]
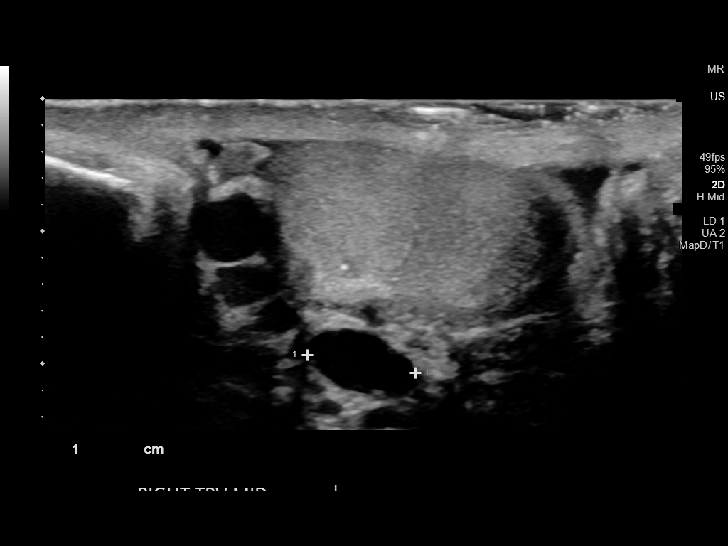
[im 19/38]
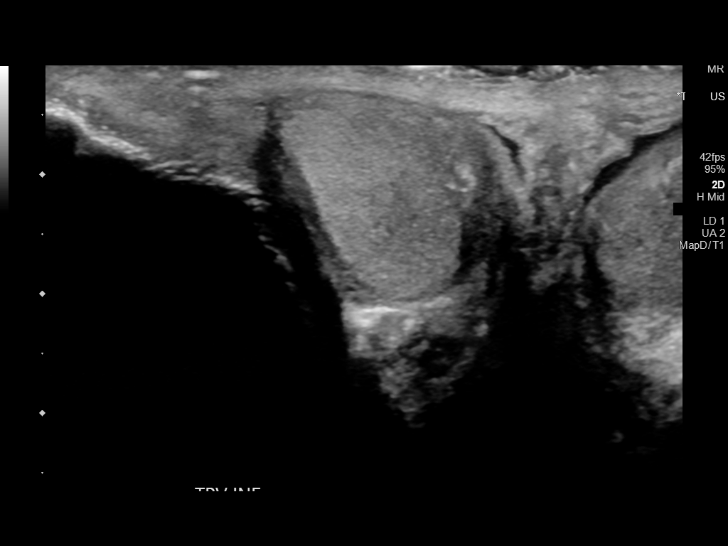
[im 22/38]
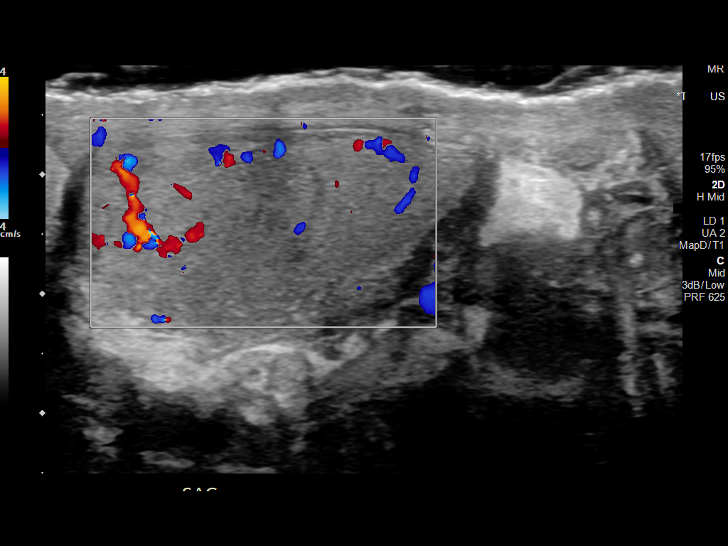
[im 25/38]
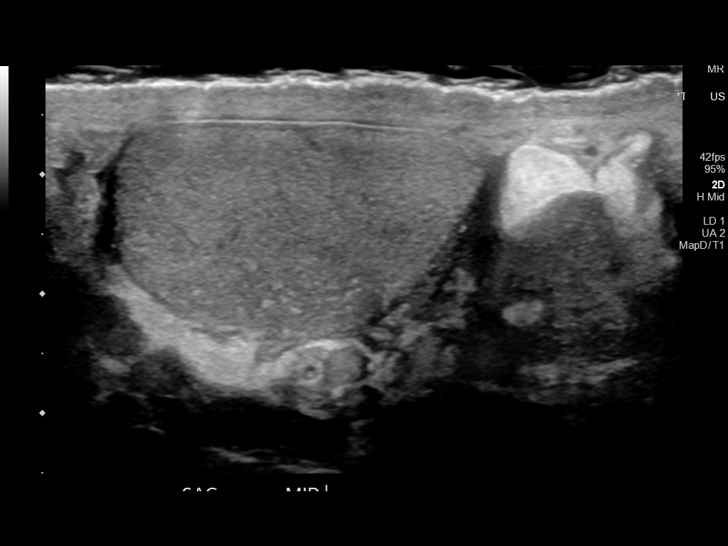
[im 28/38]
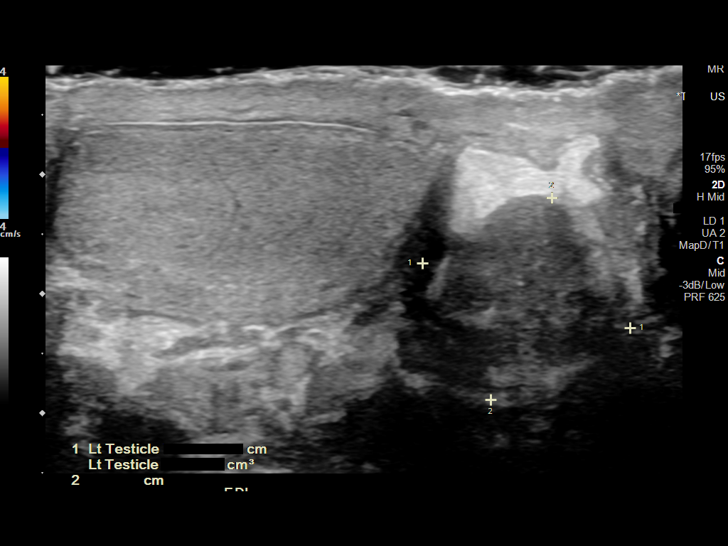
[im 31/38]
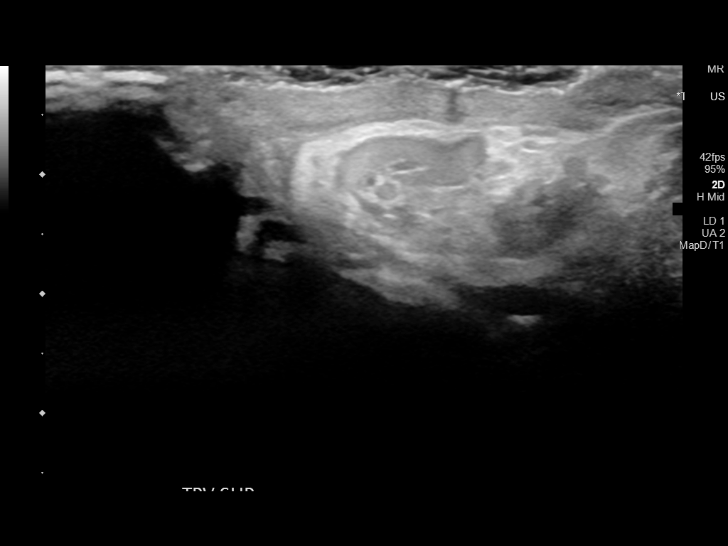
[im 34/38]
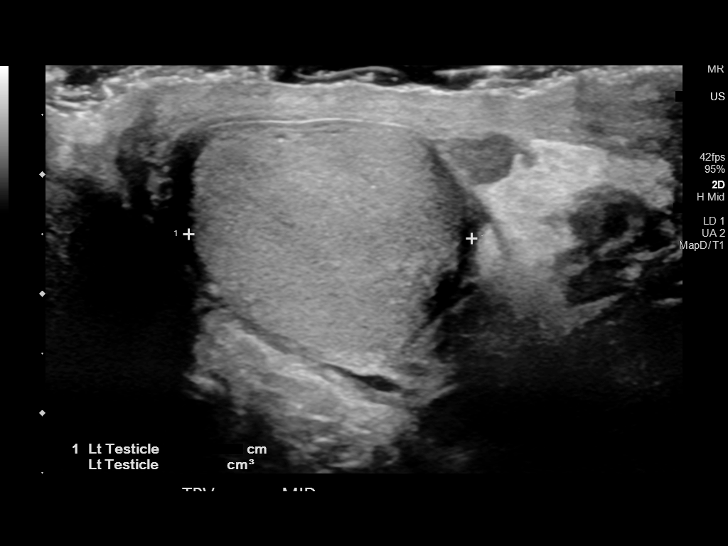
[im 38/38]
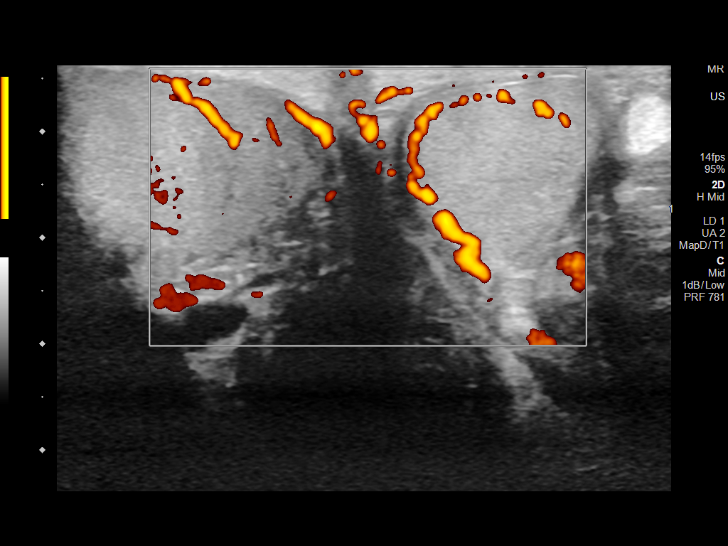

[13 of 25 positions shown; findings below may reference images not displayed]

FINDINGS: Right testicle

Measurements: 3.7 x 2.0 x 2.6 cm. Normal uniform testicular
parenchyma. No mass or microlithiasis. Normal color flow.

Left testicle

Measurements: 3.7 x 1.8 x 2.4 cm. Normal uniform testicular
parenchyma. No mass or microlithiasis. Normal color flow. Abundant
paratesticular fat protruding along the inguinal canal.

Right epididymis: Multiple cysts throughout the right epididymis,
largest cysts in the head measures to 9 mm in 6 mm. Additional cysts
the epididymal body measuring up to 9 mm as well.

Left epididymis: Mildly enlarged and slightly heterogeneous
appearance of the left epididymis with some questionably increased
color flow.

Hydrocele:  Trace bilateral hydroceles.

Varicocele:  None visualized.
IMPRESSION: 1. Mildly enlarged and heterogeneous appearance of the left
epididymis with some questionably increased color flow. Could
reflect epididymitis.
2. Multiple right epididymal cysts, largest seen in the epididymal
head and body measuring up to 9 mm.
3. Trace bilateral hydroceles.
4. Abundant left inguinal and paratesticular fat.

## 2024-02-11 ENCOUNTER — Inpatient Hospital Stay (HOSPITAL_COMMUNITY)
Admission: EM | Admit: 2024-02-11 | Discharge: 2024-03-09 | DRG: 871 | Disposition: E | Source: Ambulatory Visit | Attending: Pulmonary Disease | Admitting: Pulmonary Disease

## 2024-02-11 ENCOUNTER — Emergency Department (HOSPITAL_COMMUNITY)

## 2024-02-11 ENCOUNTER — Other Ambulatory Visit: Payer: Self-pay

## 2024-02-11 DIAGNOSIS — I4891 Unspecified atrial fibrillation: Secondary | ICD-10-CM | POA: Diagnosis not present

## 2024-02-11 DIAGNOSIS — Z1152 Encounter for screening for COVID-19: Secondary | ICD-10-CM

## 2024-02-11 DIAGNOSIS — Z7984 Long term (current) use of oral hypoglycemic drugs: Secondary | ICD-10-CM | POA: Diagnosis not present

## 2024-02-11 DIAGNOSIS — I2699 Other pulmonary embolism without acute cor pulmonale: Secondary | ICD-10-CM | POA: Diagnosis present

## 2024-02-11 DIAGNOSIS — R4182 Altered mental status, unspecified: Secondary | ICD-10-CM | POA: Diagnosis not present

## 2024-02-11 DIAGNOSIS — G9340 Encephalopathy, unspecified: Secondary | ICD-10-CM | POA: Diagnosis not present

## 2024-02-11 DIAGNOSIS — A4101 Sepsis due to Methicillin susceptible Staphylococcus aureus: Principal | ICD-10-CM | POA: Diagnosis present

## 2024-02-11 DIAGNOSIS — J9601 Acute respiratory failure with hypoxia: Secondary | ICD-10-CM | POA: Diagnosis present

## 2024-02-11 DIAGNOSIS — Z881 Allergy status to other antibiotic agents status: Secondary | ICD-10-CM

## 2024-02-11 DIAGNOSIS — R34 Anuria and oliguria: Secondary | ICD-10-CM | POA: Diagnosis present

## 2024-02-11 DIAGNOSIS — B9561 Methicillin susceptible Staphylococcus aureus infection as the cause of diseases classified elsewhere: Secondary | ICD-10-CM | POA: Insufficient documentation

## 2024-02-11 DIAGNOSIS — R7989 Other specified abnormal findings of blood chemistry: Secondary | ICD-10-CM | POA: Diagnosis present

## 2024-02-11 DIAGNOSIS — N179 Acute kidney failure, unspecified: Secondary | ICD-10-CM | POA: Diagnosis present

## 2024-02-11 DIAGNOSIS — E119 Type 2 diabetes mellitus without complications: Secondary | ICD-10-CM | POA: Diagnosis not present

## 2024-02-11 DIAGNOSIS — I9589 Other hypotension: Secondary | ICD-10-CM | POA: Diagnosis present

## 2024-02-11 DIAGNOSIS — Z515 Encounter for palliative care: Secondary | ICD-10-CM

## 2024-02-11 DIAGNOSIS — E785 Hyperlipidemia, unspecified: Secondary | ICD-10-CM | POA: Diagnosis present

## 2024-02-11 DIAGNOSIS — E1165 Type 2 diabetes mellitus with hyperglycemia: Secondary | ICD-10-CM | POA: Diagnosis present

## 2024-02-11 DIAGNOSIS — Z79899 Other long term (current) drug therapy: Secondary | ICD-10-CM | POA: Diagnosis not present

## 2024-02-11 DIAGNOSIS — B370 Candidal stomatitis: Secondary | ICD-10-CM | POA: Diagnosis present

## 2024-02-11 DIAGNOSIS — Z87891 Personal history of nicotine dependence: Secondary | ICD-10-CM

## 2024-02-11 DIAGNOSIS — K8020 Calculus of gallbladder without cholecystitis without obstruction: Secondary | ICD-10-CM | POA: Diagnosis present

## 2024-02-11 DIAGNOSIS — R579 Shock, unspecified: Secondary | ICD-10-CM

## 2024-02-11 DIAGNOSIS — R569 Unspecified convulsions: Secondary | ICD-10-CM | POA: Diagnosis not present

## 2024-02-11 DIAGNOSIS — E8729 Other acidosis: Secondary | ICD-10-CM | POA: Diagnosis present

## 2024-02-11 DIAGNOSIS — Z66 Do not resuscitate: Secondary | ICD-10-CM | POA: Diagnosis present

## 2024-02-11 DIAGNOSIS — N183 Chronic kidney disease, stage 3 unspecified: Secondary | ICD-10-CM | POA: Diagnosis present

## 2024-02-11 DIAGNOSIS — E8770 Fluid overload, unspecified: Secondary | ICD-10-CM | POA: Diagnosis present

## 2024-02-11 DIAGNOSIS — R6521 Severe sepsis with septic shock: Secondary | ICD-10-CM | POA: Diagnosis present

## 2024-02-11 DIAGNOSIS — I2510 Atherosclerotic heart disease of native coronary artery without angina pectoris: Secondary | ICD-10-CM | POA: Diagnosis present

## 2024-02-11 DIAGNOSIS — Z7982 Long term (current) use of aspirin: Secondary | ICD-10-CM

## 2024-02-11 DIAGNOSIS — Z87440 Personal history of urinary (tract) infections: Secondary | ICD-10-CM

## 2024-02-11 DIAGNOSIS — A419 Sepsis, unspecified organism: Secondary | ICD-10-CM | POA: Diagnosis not present

## 2024-02-11 DIAGNOSIS — I4510 Unspecified right bundle-branch block: Secondary | ICD-10-CM | POA: Diagnosis present

## 2024-02-11 DIAGNOSIS — Z8673 Personal history of transient ischemic attack (TIA), and cerebral infarction without residual deficits: Secondary | ICD-10-CM

## 2024-02-11 DIAGNOSIS — E1122 Type 2 diabetes mellitus with diabetic chronic kidney disease: Secondary | ICD-10-CM | POA: Diagnosis present

## 2024-02-11 DIAGNOSIS — J90 Pleural effusion, not elsewhere classified: Secondary | ICD-10-CM | POA: Diagnosis present

## 2024-02-11 DIAGNOSIS — E872 Acidosis, unspecified: Secondary | ICD-10-CM | POA: Diagnosis not present

## 2024-02-11 DIAGNOSIS — Z7901 Long term (current) use of anticoagulants: Secondary | ICD-10-CM | POA: Diagnosis not present

## 2024-02-11 DIAGNOSIS — I480 Paroxysmal atrial fibrillation: Secondary | ICD-10-CM | POA: Diagnosis present

## 2024-02-11 DIAGNOSIS — R918 Other nonspecific abnormal finding of lung field: Secondary | ICD-10-CM | POA: Diagnosis not present

## 2024-02-11 DIAGNOSIS — G9341 Metabolic encephalopathy: Secondary | ICD-10-CM | POA: Diagnosis present

## 2024-02-11 DIAGNOSIS — I5021 Acute systolic (congestive) heart failure: Secondary | ICD-10-CM | POA: Diagnosis not present

## 2024-02-11 DIAGNOSIS — I959 Hypotension, unspecified: Secondary | ICD-10-CM | POA: Diagnosis not present

## 2024-02-11 LAB — CBC WITH DIFFERENTIAL/PLATELET
Abs Immature Granulocytes: 0.6 10*3/uL — ABNORMAL HIGH (ref 0.00–0.07)
Basophils Absolute: 0 10*3/uL (ref 0.0–0.1)
Basophils Relative: 0 %
Eosinophils Absolute: 0.2 10*3/uL (ref 0.0–0.5)
Eosinophils Relative: 1 %
HCT: 44.4 % (ref 39.0–52.0)
Hemoglobin: 14.2 g/dL (ref 13.0–17.0)
Lymphocytes Relative: 6 %
Lymphs Abs: 1.2 10*3/uL (ref 0.7–4.0)
MCH: 30.4 pg (ref 26.0–34.0)
MCHC: 32 g/dL (ref 30.0–36.0)
MCV: 95.1 fL (ref 80.0–100.0)
Monocytes Absolute: 0.6 10*3/uL (ref 0.1–1.0)
Monocytes Relative: 3 %
Myelocytes: 3 %
Neutro Abs: 17.1 10*3/uL — ABNORMAL HIGH (ref 1.7–7.7)
Neutrophils Relative %: 87 %
Platelets: 190 10*3/uL (ref 150–400)
RBC: 4.67 MIL/uL (ref 4.22–5.81)
RDW: 14.1 % (ref 11.5–15.5)
WBC: 19.6 10*3/uL — ABNORMAL HIGH (ref 4.0–10.5)
nRBC: 0 % (ref 0.0–0.2)
nRBC: 0 /100{WBCs}

## 2024-02-11 LAB — CBG MONITORING, ED
Glucose-Capillary: 370 mg/dL — ABNORMAL HIGH (ref 70–99)
Glucose-Capillary: 341 mg/dL — ABNORMAL HIGH (ref 70–99)

## 2024-02-11 LAB — URINALYSIS, W/ REFLEX TO CULTURE (INFECTION SUSPECTED)
Bilirubin Urine: NEGATIVE
Glucose, UA: >=500 mg/dL — AB
Ketones, ur: NEGATIVE mg/dL
Leukocytes,Ua: NEGATIVE
Nitrite: NEGATIVE
Protein, ur: 100 mg/dL — AB
Specific Gravity, Urine: 1.017 (ref 1.005–1.030)
pH: 5 (ref 5.0–8.0)

## 2024-02-11 LAB — CBC
HCT: 40.5 % (ref 39.0–52.0)
Hemoglobin: 13.1 g/dL (ref 13.0–17.0)
MCH: 30.1 pg (ref 26.0–34.0)
MCHC: 32.3 g/dL (ref 30.0–36.0)
MCV: 93.1 fL (ref 80.0–100.0)
Platelets: 165 10*3/uL (ref 150–400)
RBC: 4.35 MIL/uL (ref 4.22–5.81)
RDW: 14.3 % (ref 11.5–15.5)
WBC: 8.6 10*3/uL (ref 4.0–10.5)
nRBC: 0 % (ref 0.0–0.2)

## 2024-02-11 LAB — I-STAT VENOUS BLOOD GAS, ED
Acid-base deficit: 12 mmol/L — ABNORMAL HIGH (ref 0.0–2.0)
Bicarbonate: 14.1 mmol/L — ABNORMAL LOW (ref 20.0–28.0)
Calcium, Ion: 0.98 mmol/L — ABNORMAL LOW (ref 1.15–1.40)
HCT: 48 % (ref 39.0–52.0)
Hemoglobin: 16.3 g/dL (ref 13.0–17.0)
O2 Saturation: 48 %
Potassium: >8.5 mmol/L (ref 3.5–5.1)
Sodium: 127 mmol/L — ABNORMAL LOW (ref 135–145)
TCO2: 15 mmol/L — ABNORMAL LOW (ref 22–32)
pCO2, Ven: 31.5 mmHg — ABNORMAL LOW (ref 44–60)
pH, Ven: 7.259 (ref 7.25–7.43)
pO2, Ven: 30 mmHg — CL (ref 32–45)

## 2024-02-11 LAB — CREATININE, SERUM
Creatinine, Ser: 4.06 mg/dL — ABNORMAL HIGH (ref 0.61–1.24)
GFR, Estimated: 15 mL/min — ABNORMAL LOW (ref >60)

## 2024-02-11 LAB — RESP PANEL BY RT-PCR (RSV, FLU A&B, COVID)  RVPGX2
Influenza A by PCR: NEGATIVE
Influenza B by PCR: NEGATIVE
Resp Syncytial Virus by PCR: NEGATIVE
SARS Coronavirus 2 by RT PCR: NEGATIVE

## 2024-02-11 LAB — COMPREHENSIVE METABOLIC PANEL
ALT: 32 U/L (ref 0–44)
AST: 71 U/L — ABNORMAL HIGH (ref 15–41)
Albumin: 2.9 g/dL — ABNORMAL LOW (ref 3.5–5.0)
Alkaline Phosphatase: 39 U/L (ref 38–126)
Anion gap: 26 — ABNORMAL HIGH (ref 5–15)
BUN: 70 mg/dL — ABNORMAL HIGH (ref 8–23)
CO2: 11 mmol/L — ABNORMAL LOW (ref 22–32)
Calcium: 10.2 mg/dL (ref 8.9–10.3)
Chloride: 98 mmol/L (ref 98–111)
Creatinine, Ser: 4.18 mg/dL — ABNORMAL HIGH (ref 0.61–1.24)
GFR, Estimated: 14 mL/min — ABNORMAL LOW (ref >60)
Glucose, Bld: 433 mg/dL — ABNORMAL HIGH (ref 70–99)
Potassium: 4.6 mmol/L (ref 3.5–5.1)
Sodium: 135 mmol/L (ref 135–145)
Total Bilirubin: 1.5 mg/dL — ABNORMAL HIGH (ref 0.0–1.2)
Total Protein: 7.5 g/dL (ref 6.5–8.1)

## 2024-02-11 LAB — ETHANOL: Alcohol, Ethyl (B): <10 mg/dL (ref <10)

## 2024-02-11 LAB — ACETAMINOPHEN LEVEL: Acetaminophen (Tylenol), Serum: <10 ug/mL — ABNORMAL LOW (ref 10–30)

## 2024-02-11 LAB — AMMONIA: Ammonia: 24 umol/L (ref 9–35)

## 2024-02-11 LAB — LIPASE, BLOOD: Lipase: 13 U/L (ref 11–51)

## 2024-02-11 LAB — I-STAT CG4 LACTIC ACID, ED
Lactic Acid, Venous: 7.3 mmol/L (ref 0.5–1.9)
Lactic Acid, Venous: 3.9 mmol/L (ref 0.5–1.9)

## 2024-02-11 LAB — TROPONIN I (HIGH SENSITIVITY)
Troponin I (High Sensitivity): 289 ng/L (ref <18)
Troponin I (High Sensitivity): 189 ng/L (ref <18)

## 2024-02-11 LAB — GLUCOSE, CAPILLARY
Glucose-Capillary: 313 mg/dL — ABNORMAL HIGH (ref 70–99)
Glucose-Capillary: 255 mg/dL — ABNORMAL HIGH (ref 70–99)

## 2024-02-11 LAB — HEMOGLOBIN A1C
Hgb A1c MFr Bld: 8.8 % — ABNORMAL HIGH (ref 4.8–5.6)
Mean Plasma Glucose: 205.86 mg/dL

## 2024-02-11 LAB — BRAIN NATRIURETIC PEPTIDE: B Natriuretic Peptide: 458.2 pg/mL — ABNORMAL HIGH (ref 0.0–100.0)

## 2024-02-11 LAB — PROTIME-INR
INR: 2.2 — ABNORMAL HIGH (ref 0.8–1.2)
Prothrombin Time: 24.3 s — ABNORMAL HIGH (ref 11.4–15.2)

## 2024-02-11 LAB — MAGNESIUM: Magnesium: 1.6 mg/dL — ABNORMAL LOW (ref 1.7–2.4)

## 2024-02-11 LAB — MRSA NEXT GEN BY PCR, NASAL: MRSA by PCR Next Gen: NOT DETECTED

## 2024-02-11 LAB — BETA-HYDROXYBUTYRIC ACID: Beta-Hydroxybutyric Acid: 3.31 mmol/L — ABNORMAL HIGH (ref 0.05–0.27)

## 2024-02-11 LAB — APTT: aPTT: 43 s — ABNORMAL HIGH (ref 24–36)

## 2024-02-11 LAB — SALICYLATE LEVEL: Salicylate Lvl: <7 mg/dL — ABNORMAL LOW (ref 7.0–30.0)

## 2024-02-11 LAB — LACTIC ACID, PLASMA: Lactic Acid, Venous: 5.5 mmol/L (ref 0.5–1.9)

## 2024-02-11 MED ORDER — LACTATED RINGERS IV BOLUS
500.0000 mL | Freq: Once | INTRAVENOUS | Status: AC
  Administered 2024-02-11: 500 mL via INTRAVENOUS

## 2024-02-11 MED ORDER — LACTATED RINGERS IV SOLN: INTRAVENOUS | Status: DC

## 2024-02-11 MED ORDER — LACTATED RINGERS IV BOLUS (SEPSIS)
1000.0000 mL | Freq: Once | INTRAVENOUS | Status: AC
  Administered 2024-02-11: 1000 mL via INTRAVENOUS

## 2024-02-11 MED ORDER — DOCUSATE SODIUM 100 MG PO CAPS: 100.0000 mg | ORAL_CAPSULE | Freq: Two times a day (BID) | ORAL | Status: DC | PRN

## 2024-02-11 MED ORDER — HEPARIN SODIUM (PORCINE) 5000 UNIT/ML IJ SOLN
5000.0000 [IU] | Freq: Three times a day (TID) | INTRAMUSCULAR | Status: DC
Start: 2024-02-11 — End: 2024-02-12
  Administered 2024-02-11 – 2024-02-12 (×2): 5000 [IU] via SUBCUTANEOUS
  Filled 2024-02-11 (×2): qty 1

## 2024-02-11 MED ORDER — LACTATED RINGERS IV BOLUS (SEPSIS): 1000.0000 mL | Freq: Once | INTRAVENOUS | Status: DC

## 2024-02-11 MED ORDER — NOREPINEPHRINE 4 MG/250ML-% IV SOLN
0.0000 ug/min | INTRAVENOUS | Status: DC
  Administered 2024-02-12: 9 ug/min via INTRAVENOUS
  Administered 2024-02-12: 3 ug/min via INTRAVENOUS
  Administered 2024-02-13: 20 ug/min via INTRAVENOUS
  Administered 2024-02-13: 11 ug/min via INTRAVENOUS
  Filled 2024-02-11 (×4): qty 250

## 2024-02-11 MED ORDER — AMIODARONE HCL IN DEXTROSE 360-4.14 MG/200ML-% IV SOLN
60.0000 mg/h | INTRAVENOUS | Status: DC
Start: 2024-02-11 — End: 2024-02-13
  Administered 2024-02-12 (×2): 30 mg/h via INTRAVENOUS
  Administered 2024-02-12 – 2024-02-13 (×2): 60 mg/h via INTRAVENOUS
  Filled 2024-02-11 (×4): qty 200

## 2024-02-11 MED ORDER — VANCOMYCIN HCL 1500 MG/300ML IV SOLN
1500.0000 mg | Freq: Once | INTRAVENOUS | Status: AC
  Administered 2024-02-11: 1500 mg via INTRAVENOUS
  Filled 2024-02-11: qty 300

## 2024-02-11 MED ORDER — SODIUM CHLORIDE 0.9 % IV SOLN
2.0000 g | Freq: Once | INTRAVENOUS | Status: AC
  Administered 2024-02-11: 2 g via INTRAVENOUS
  Filled 2024-02-11: qty 12.5

## 2024-02-11 MED ORDER — SODIUM CHLORIDE 0.9 % IV BOLUS
500.0000 mL | Freq: Once | INTRAVENOUS | Status: AC
  Administered 2024-02-11: 500 mL via INTRAVENOUS

## 2024-02-11 MED ORDER — AMIODARONE LOAD VIA INFUSION
150.0000 mg | Freq: Once | INTRAVENOUS | Status: AC
  Administered 2024-02-11: 150 mg via INTRAVENOUS
  Filled 2024-02-11: qty 83.34

## 2024-02-11 MED ORDER — METRONIDAZOLE 500 MG/100ML IV SOLN
500.0000 mg | Freq: Once | INTRAVENOUS | Status: AC
  Administered 2024-02-11: 500 mg via INTRAVENOUS
  Filled 2024-02-11: qty 100

## 2024-02-11 MED ORDER — POLYETHYLENE GLYCOL 3350 17 G PO PACK: 17.0000 g | PACK | Freq: Every day | ORAL | Status: DC | PRN

## 2024-02-11 MED ORDER — VANCOMYCIN HCL IN DEXTROSE 1-5 GM/200ML-% IV SOLN: 1000.0000 mg | Freq: Once | INTRAVENOUS | Status: DC

## 2024-02-11 MED ORDER — HEPARIN SODIUM (PORCINE) 5000 UNIT/ML IJ SOLN: 5000.0000 [IU] | Freq: Three times a day (TID) | INTRAMUSCULAR | Status: DC

## 2024-02-11 MED ORDER — CALCIUM GLUCONATE-NACL 2-0.675 GM/100ML-% IV SOLN
2.0000 g | Freq: Once | INTRAVENOUS | Status: AC
  Administered 2024-02-11: 2000 mg via INTRAVENOUS
  Filled 2024-02-11: qty 100

## 2024-02-11 MED ORDER — SODIUM CHLORIDE 0.9 % IV SOLN
1.0000 g | INTRAVENOUS | Status: DC
  Administered 2024-02-11: 1 g via INTRAVENOUS
  Filled 2024-02-11: qty 10

## 2024-02-11 MED ORDER — SODIUM CHLORIDE 0.9 % IV SOLN: 250.0000 mL | INTRAVENOUS | Status: AC

## 2024-02-11 MED ORDER — MAGNESIUM SULFATE 2 GM/50ML IV SOLN
2.0000 g | Freq: Once | INTRAVENOUS | Status: AC
  Administered 2024-02-11: 2 g via INTRAVENOUS
  Filled 2024-02-11: qty 50

## 2024-02-11 MED ORDER — MIDODRINE HCL 5 MG PO TABS
10.0000 mg | ORAL_TABLET | Freq: Three times a day (TID) | ORAL | Status: DC
  Filled 2024-02-11: qty 2

## 2024-02-11 MED ORDER — LACTATED RINGERS IV BOLUS (SEPSIS): 500.0000 mL | Freq: Once | INTRAVENOUS | Status: DC

## 2024-02-11 MED ORDER — AMIODARONE HCL IN DEXTROSE 360-4.14 MG/200ML-% IV SOLN
60.0000 mg/h | INTRAVENOUS | Status: AC
  Administered 2024-02-11 (×2): 60 mg/h via INTRAVENOUS
  Filled 2024-02-11 (×2): qty 200

## 2024-02-11 MED ORDER — NOREPINEPHRINE 4 MG/250ML-% IV SOLN
0.0000 ug/min | INTRAVENOUS | Status: DC
  Administered 2024-02-11: 5 ug/min via INTRAVENOUS
  Filled 2024-02-11: qty 250

## 2024-02-11 MED ORDER — INSULIN ASPART 100 UNIT/ML IJ SOLN
0.0000 [IU] | INTRAMUSCULAR | Status: DC
  Administered 2024-02-11: 8 [IU] via SUBCUTANEOUS
  Administered 2024-02-11: 11 [IU] via SUBCUTANEOUS
  Administered 2024-02-12: 2 [IU] via SUBCUTANEOUS
  Administered 2024-02-12 – 2024-02-13 (×3): 3 [IU] via SUBCUTANEOUS

## 2024-02-11 MED ORDER — SODIUM CHLORIDE 0.9 % IV SOLN: INTRAVENOUS | Status: AC

## 2024-02-11 NOTE — Progress Notes (Signed)
 eLink Physician-Brief Progress Note Patient Name: Jason Yoder DOB: 17-Jul-1946 MRN: [id number]   Date of Service  02/11/2024  HPI/Events of Note  Lactic acid 3.9 to 5.5 On low dose levophed Maintaining MAP UOP poor  eICU Interventions  LR 500 cc IV x 1 now     Intervention Category Intermediate Interventions: OtherHenry Russel, Demetrius Charity 02/11/2024, 8:58 PM

## 2024-02-11 NOTE — ED Triage Notes (Signed)
 EMS from eye care facility. Per ems pt was there for routine eye apt, and staff at eye care called 911 for pt altered mentation and kussmaul breathing.  Pt presents altered, unknown last well, with kussmaul breathing, in afib rvr. RT called and at bedside. Pt placed on cardiac and O2 monitoring.

## 2024-02-11 NOTE — H&P (Signed)
 NAME:  Jason Yoder, MRN:  [id number], DOB:  1945-12-15, LOS: 0 ADMISSION DATE:  02/11/2024, CONSULTATION DATE:  02/11/24 REFERRING MD:  A. Karie Mainland -- EM PA, CHIEF COMPLAINT:  AMS   History of Present Illness:  78 yo M PMH  arthritis with limited mobility, HLD, pAF on eliquis & intolerant of toprol 2/2 hypotension, CKD 3, prior stroke, chronic hypotension on midodrine, RBBB  who is a nursing home resident arriving with nursing home paperwork indicating DNR code status (does not arrive with MOST form however) who presented to ED 3/5 from his ophthalmologist office where he was felt to be altered and have an abnormal breathing pattern  Hx from SNF was that he as been weak x 1 week, a few episodes of vomiting, and generally feeling unwell. In ED, rcvd 1 L IVF. Found to be in afib RVR. SBP < 100 so was started on peripheral pressors.  PCCM called in this setting  At time of consultation, imaging is pending.  Prelim labs w bicarb 11, Cr 4.18, BUN 70, Glu 433. Mag 1.6. BNP 460, trop 290,   WBC 19  I STAT LA is 7.3  Looks like this was also with an iSTAT VBG sample which seems inaccurate, noting an istat K > 8.5 but serum 4.6  He is quite altered and not able to provide much history. I have tried calling his daughter but have not been able to successfully connect   Pertinent  Medical History  CKD 3 Hypotension CAD CVA TBI Arthritis DNR  pAF Chronic anticoagulation   Significant Hospital Events: Including procedures, antibiotic start and stop dates in addition to other pertinent events   3/5 ED from eye appointment w AMS and abnormal resp pattern. Started on periph pressors   Interim History / Subjective:  Weaning off NE   Objective   Blood pressure 93/61, pulse (!) 131, temperature 98.3 F (36.8 C), temperature source Oral, resp. rate (!) 35, SpO2 97%.       No intake or output data in the 24 hours ending 02/11/24 1438 There were no vitals filed for this visit.  Examination: General:  ill appearing older adult M HENT: NCAT. Dry pink mm  Lungs: Symmetrical chest expansion, incr RR  Cardiovascular: irir cap refill 3 sec  Abdomen: diffusely tender  Extremities: no acute joint deformity. Chronic arthritic changes  Neuro: Awake, Disoriented, some aphasia. Intermittently answering simple questions seemingly appropriately  GU: defer   Resolved Hospital Problem list     Assessment & Plan:   Acute encephalopathy  -think this is likely metabolic however is reasonable to r/o intracranial process  P -for CT H  -holding AC until we see his CT H  -delirium precautions -tx confounding dx as below   Shock, improving Lactic acidosis -favor septic shock, could be cardiogenic, or component of hypovolemia w his hx n/v   -his abd sx are c/f something like pyelo. He cannot provide hx re dysuria  P -NE for MAP 65. Changing the orders to the appropriate peripheral parameters/associated IV needs  -empiric abx -follow cx data, send UA  -give another 1L LR then mIVF  -ECHO  -repeat LA -- first was off of istat and based on the K from the istat might be hemolyzed  -CT a/p is pending   Afib on eliquis RBBB Chronic hypotension on midodrine  P -amio  -pending imaging, hep gtt  -restart midodrine 10mg  TID   AKI on CKD 3 Metabolic acidosis  Hypocalcemia Hypomagnesemia  P -foley  -IVF as above  -Strict I/O follow renal indices -renal dose abx -replace Mag and Ca   DM with hyperglycemia -send BHA  -SSI   GOC  DNR  -DNR noted in his nursing home paperwork  -have tried to reach daughter, no success   Best Practice (right click and "Reselect all SmartList Selections" daily)   Diet/type: NPO w/ oral meds DVT prophylaxis systemic heparin -- if CT H without acute finding  Pressure ulcer(s): pressure ulcer assessment deferred  GI prophylaxis: N/A Lines: N/A Foley:  Yes, and it is still needed (pending insertion) Code Status:  DNR Last date of multidisciplinary goals  of care discussion [--]  Labs   CBC: Recent Labs  Lab 02/11/24 1145 02/11/24 1219  WBC 19.6*  --   NEUTROABS 17.1*  --   HGB 14.2 16.3  HCT 44.4 48.0  MCV 95.1  --   PLT 190  --     Basic Metabolic Panel: Recent Labs  Lab 02/11/24 1145 02/11/24 1219  NA 135 127*  K 4.6 >8.5*  CL 98  --   CO2 11*  --   GLUCOSE 433*  --   BUN 70*  --   CREATININE 4.18*  --   CALCIUM 10.2  --   MG 1.6*  --    GFR: CrCl cannot be calculated (Unknown ideal weight.). Recent Labs  Lab 02/11/24 1145 02/11/24 1219 02/11/24 1406  WBC 19.6*  --   --   LATICACIDVEN  --  7.3* 3.9*    Liver Function Tests: Recent Labs  Lab 02/11/24 1145  AST 71*  ALT 32  ALKPHOS 39  BILITOT 1.5*  PROT 7.5  ALBUMIN 2.9*   Recent Labs  Lab 02/11/24 1145  LIPASE 13   No results for input(s): "AMMONIA" in the last 168 hours.  ABG    Component Value Date/Time   HCO3 14.1 (L) 02/11/2024 1219   TCO2 15 (L) 02/11/2024 1219   ACIDBASEDEF 12.0 (H) 02/11/2024 1219   O2SAT 48 02/11/2024 1219     Coagulation Profile: No results for input(s): "INR", "PROTIME" in the last 168 hours.  Cardiac Enzymes: No results for input(s): "CKTOTAL", "CKMB", "CKMBINDEX", "TROPONINI" in the last 168 hours.  HbA1C: Hemoglobin A1C  Date/Time Value Ref Range Status  09/04/2018 09:50 AM 10.0 (A) 4.0 - 5.6 % Corrected  06/08/2018 08:50 AM 9.4 (A) 4.0 - 5.6 % Final    CBG: Recent Labs  Lab 02/11/24 1145  GLUCAP 370*    Review of Systems:   Limited due to degree of encephalopathy  + abd pain + weakness  + confusion  + n/v  + SOB   Past Medical History:  He,  has a past medical history of Allergy, Diabetes mellitus without complication (HCC), Hyperlipidemia, Refused pneumococcal vaccine (04/03/2018), and Urine incontinence.   Surgical History:   Past Surgical History:  Procedure Laterality Date   HUMERUS FRACTURE SURGERY  06/1994   WRIST SURGERY  03/2013     Social History:   reports that he  quit smoking about 11 years ago. His smoking use included cigarettes. He started smoking about 51 years ago. He has a 20 pack-year smoking history. He quit smokeless tobacco use about 55 years ago. He reports that he does not drink alcohol and does not use drugs.   Family History:  His family history is not on file.   Allergies Allergies  Allergen Reactions   Vancomycin Rash     Home Medications  Prior  to Admission medications   Medication Sig Start Date End Date Taking? Authorizing Provider  sertraline (ZOLOFT) 25 MG tablet Take by mouth. 01/30/24  Yes [provider]  aspirin EC 81 MG tablet Take 1 tablet (81 mg total) by mouth daily. Patient not taking: Reported on 09/21/2020 04/03/18   Willow Ora, MD  atorvastatin (LIPITOR) 80 MG tablet Take 80 mg by mouth daily.    [provider]  Blood Glucose Monitoring Suppl DEVI Use as directed twice daily 05/16/17   [provider]  clotrimazole-betamethasone (LOTRISONE) cream Apply 1 inch topically twice daily as needed for rash    [provider]  dapagliflozin propanediol (FARXIGA) 10 MG TABS tablet Take 10 mg by mouth daily. Patient not taking: Reported on 09/21/2020 04/30/18   Willow Ora, MD  ELIQUIS 5 MG TABS tablet Take 5 mg by mouth 2 (two) times daily.    [provider]  fenofibrate (TRICOR) 145 MG tablet Take 145 mg by mouth daily.    [provider]  Glucose Blood (BLOOD GLUCOSE TEST STRIPS) STRP Use as instructed 05/16/17   [provider]  HUMULIN 70/30 (70-30) 100 UNIT/ML injection Inject 12-14 Units into the skin 2 (two) times daily. 14 units in the morning 12 units at night 06/17/20   [provider]  Lancets MISC Use as directed. 05/16/17   [provider]  levofloxacin (LEVAQUIN) 500 MG tablet Take 1 tablet (500 mg total) by mouth daily. 09/21/20   Wynetta Fines, MD  lisinopril (PRINIVIL,ZESTRIL) 5 MG tablet Take 1 tablet (5 mg total) by mouth  daily. 06/30/18   Willow Ora, MD  metFORMIN (GLUCOPHAGE) 1000 MG tablet Take 1,000 mg by mouth 2 (two) times daily. 08/31/19   [provider]  metFORMIN (GLUCOPHAGE) 500 MG tablet Take 2 tablets (1,000 mg total) by mouth 2 (two) times daily with a meal. Patient not taking: Reported on 09/21/2020 04/03/18   Willow Ora, MD  metoprolol tartrate (LOPRESSOR) 25 MG tablet Take 12.5 mg by mouth daily.    [provider]  midodrine (PROAMATINE) 10 MG tablet Take 10 mg by mouth 3 (three) times daily.    [provider]  moxifloxacin (VIGAMOX) 0.5 % ophthalmic solution Apply to eye.    [provider]  pregabalin (LYRICA) 25 MG capsule Take 25 mg by mouth at bedtime.    [provider]  simvastatin (ZOCOR) 20 MG tablet Take 1 tablet (20 mg total) by mouth at bedtime. Patient not taking: Reported on 09/21/2020 06/30/18   Willow Ora, MD  simvastatin (ZOCOR) 40 MG tablet Take 40 mg by mouth at bedtime. 04/18/20   [provider]     Critical care time: 59 min     CRITICAL CARE Performed by: Lanier Clam  Total critical care time: 59 minutes  Critical care time was exclusive of separately billable procedures and treating other patients. Critical care was necessary to treat or prevent imminent or life-threatening deterioration.  Critical care was time spent personally by me on the following activities: development of treatment plan with patient and/or surrogate as well as nursing, discussions with consultants, evaluation of patient's response to treatment, examination of patient, obtaining history from patient or surrogate, ordering and performing treatments and interventions, ordering and review of laboratory studies, ordering and review of radiographic studies, pulse oximetry and re-evaluation of patient's condition.  Tessie Fass MSN, AGACNP-BC [facility] Pulmonary/Critical Care Medicine Amion for pager 02/11/2024, 2:38 PM

## 2024-02-11 NOTE — ED Notes (Signed)
 Provider notified of Critical Trop

## 2024-02-11 NOTE — ED Notes (Signed)
 To CT at 1425 with RN transport and cardiac monitor

## 2024-02-11 NOTE — ED Provider Notes (Signed)
 [place] EMERGENCY DEPARTMENT AT [facility] Provider Note   CSN: [id number] Arrival date & time: 02/11/24  1132     History  Chief Complaint  Patient presents with   Altered Mental Status   Shortness of Breath    Jason Yoder is a 78 y.o. male.  78 year old male with past medical history significant for CVA, A-fib presents today for concern of altered mental status, respiratory distress.  He was at his ophthalmologist when he was discovered to be in A-fib and altered so EMS was activated.  Spoke with patient's nursing home.  Nurse Mickle Mallory states that patient has not been feeling well now for about a week however he has been particularly not well over the past 2 days.  He had a couple episodes of vomiting over the weekend.  There are states that this is not unusual for him but it was more frequent.  He does take midodrine for low blood pressure.  He has history of diabetes but not on insulin.  Only takes metformin.  He does have history of UTIs.  At baseline patient is verbal and able to communicate without issue.  Will obtain history from patient due to acuity of condition.  The history is provided by the patient. No language interpreter was used.       Home Medications Prior to Admission medications   Medication Sig Start Date End Date Taking? Authorizing Provider  sertraline (ZOLOFT) 25 MG tablet Take by mouth. 01/30/24  Yes [provider]  aspirin EC 81 MG tablet Take 1 tablet (81 mg total) by mouth daily. Patient not taking: Reported on 09/21/2020 04/03/18   Willow Ora, MD  atorvastatin (LIPITOR) 80 MG tablet Take 80 mg by mouth daily.    [provider]  Blood Glucose Monitoring Suppl DEVI Use as directed twice daily 05/16/17   [provider]  clotrimazole-betamethasone (LOTRISONE) cream Apply 1 inch topically twice daily as needed for rash    [provider]  dapagliflozin propanediol (FARXIGA) 10 MG TABS tablet Take 10 mg by  mouth daily. Patient not taking: Reported on 09/21/2020 04/30/18   Willow Ora, MD  ELIQUIS 5 MG TABS tablet Take 5 mg by mouth 2 (two) times daily.    [provider]  fenofibrate (TRICOR) 145 MG tablet Take 145 mg by mouth daily.    [provider]  Glucose Blood (BLOOD GLUCOSE TEST STRIPS) STRP Use as instructed 05/16/17   [provider]  HUMULIN 70/30 (70-30) 100 UNIT/ML injection Inject 12-14 Units into the skin 2 (two) times daily. 14 units in the morning 12 units at night 06/17/20   [provider]  Lancets MISC Use as directed. 05/16/17   [provider]  levofloxacin (LEVAQUIN) 500 MG tablet Take 1 tablet (500 mg total) by mouth daily. 09/21/20   Wynetta Fines, MD  lisinopril (PRINIVIL,ZESTRIL) 5 MG tablet Take 1 tablet (5 mg total) by mouth daily. 06/30/18   Willow Ora, MD  metFORMIN (GLUCOPHAGE) 1000 MG tablet Take 1,000 mg by mouth 2 (two) times daily. 08/31/19   [provider]  metFORMIN (GLUCOPHAGE) 500 MG tablet Take 2 tablets (1,000 mg total) by mouth 2 (two) times daily with a meal. Patient not taking: Reported on 09/21/2020 04/03/18   Willow Ora, MD  metoprolol tartrate (LOPRESSOR) 25 MG tablet Take 12.5 mg by mouth daily.    [provider]  midodrine (PROAMATINE) 10 MG tablet Take 10 mg by mouth  3 (three) times daily.    [provider]  moxifloxacin (VIGAMOX) 0.5 % ophthalmic solution Apply to eye.    [provider]  pregabalin (LYRICA) 25 MG capsule Take 25 mg by mouth at bedtime.    [provider]  simvastatin (ZOCOR) 20 MG tablet Take 1 tablet (20 mg total) by mouth at bedtime. Patient not taking: Reported on 09/21/2020 06/30/18   Willow Ora, MD  simvastatin (ZOCOR) 40 MG tablet Take 40 mg by mouth at bedtime. 04/18/20   [provider]      Allergies    Vancomycin    Review of Systems   Review of Systems  Unable to perform ROS: Acuity of condition  All  other systems reviewed and are negative.   Physical Exam Updated Vital Signs BP (!) 145/124   Pulse (!) 154   Temp 98.3 F (36.8 C) (Oral)   Resp (!) 40   SpO2 100%  Physical Exam Vitals and nursing note reviewed.  Constitutional:      General: He is in acute distress.     Appearance: Normal appearance. He is ill-appearing.  HENT:     Head: Normocephalic and atraumatic.     Nose: Nose normal.  Eyes:     Conjunctiva/sclera: Conjunctivae normal.  Cardiovascular:     Rate and Rhythm: Tachycardia present. Rhythm irregular.  Pulmonary:     Effort: Respiratory distress present.  Abdominal:     Tenderness: There is abdominal tenderness. There is guarding.  Musculoskeletal:        General: No deformity. Normal range of motion.     Cervical back: Normal range of motion.     Right lower leg: No edema.     Left lower leg: No edema.  Skin:    Findings: No rash.  Neurological:     Mental Status: He is alert.     ED Results / Procedures / Treatments   Labs (all labs ordered are listed, but only abnormal results are displayed) Labs Reviewed  CBG MONITORING, ED - Abnormal; Notable for the following components:      Result Value   Glucose-Capillary 370 (*)    All other components within normal limits  I-STAT CG4 LACTIC ACID, ED - Abnormal; Notable for the following components:   Lactic Acid, Venous 7.3 (*)    All other components within normal limits  I-STAT VENOUS BLOOD GAS, ED - Abnormal; Notable for the following components:   pCO2, Ven 31.5 (*)    pO2, Ven 30 (*)    Bicarbonate 14.1 (*)    TCO2 15 (*)    Acid-base deficit 12.0 (*)    Sodium 127 (*)    Potassium >8.5 (*)    Calcium, Ion 0.98 (*)    All other components within normal limits  RESP PANEL BY RT-PCR (RSV, FLU A&B, COVID)  RVPGX2  CULTURE, BLOOD (ROUTINE X 2)  CULTURE, BLOOD (ROUTINE X 2)  ACETAMINOPHEN LEVEL  AMMONIA  ETHANOL  MAGNESIUM  SALICYLATE LEVEL  COMPREHENSIVE METABOLIC PANEL  CBC WITH  DIFFERENTIAL/PLATELET  PROTIME-INR  APTT  LIPASE, BLOOD  BRAIN NATRIURETIC PEPTIDE  URINALYSIS, W/ REFLEX TO CULTURE (INFECTION SUSPECTED)  I-STAT CHEM 8, ED  TROPONIN I (HIGH SENSITIVITY)    EKG EKG Interpretation Date/Time:  Wednesday February 11 2024 11:43:16 EST Ventricular Rate:  153 PR Interval:  152 QRS Duration:  129 QT Interval:  319 QTC Calculation: 509 R Axis:   250  Text Interpretation: Wide-QRS tachycardia Right bundle branch block  Baseline wander in lead(s) I II aVR rate faster than prior 10/21 Confirmed by Meridee Score [phone]) on 02/11/2024 11:54:53 AM  Radiology No results found.  Procedures .Critical Care  Performed by: Marita Kansas, PA-C Authorized by: Marita Kansas, PA-C   Critical care provider statement:    Critical care time (minutes):  50   Critical care was necessary to treat or prevent imminent or life-threatening deterioration of the following conditions:  Respiratory failure, sepsis and shock   Critical care was time spent personally by me on the following activities:  Development of treatment plan with patient or surrogate, discussions with consultants, evaluation of patient's response to treatment, examination of patient, ordering and review of laboratory studies, ordering and review of radiographic studies, ordering and performing treatments and interventions, pulse oximetry, re-evaluation of patient's condition and review of old charts   Care discussed with: admitting provider       Medications Ordered in ED Medications  lactated ringers infusion ( Intravenous New Bag/Given 02/11/24 1208)  lactated ringers bolus 1,000 mL (1,000 mLs Intravenous New Bag/Given 02/11/24 1203)    And  lactated ringers bolus 1,000 mL (has no administration in time range)    And  lactated ringers bolus 1,000 mL (has no administration in time range)    And  lactated ringers bolus 500 mL (has no administration in time range)  ceFEPIme (MAXIPIME) 2 g in sodium chloride 0.9 %  100 mL IVPB (has no administration in time range)  metroNIDAZOLE (FLAGYL) IVPB 500 mg (has no administration in time range)  vancomycin (VANCOREADY) IVPB 1500 mg/300 mL (has no administration in time range)    ED Course/ Medical Decision Making/ A&P Clinical Course as of 02/11/24 1233  Wed Feb 11, 2024  2370 78 year old male with prior history of stroke A-fib who was at the doctor's office today when he began experiencing change in mental status, rapid heart rate rapid breathing.  Patient unable to give any history at this time.  Lungs are clear, abdomen soft.  Getting labs and imaging.  Disposition per results of testing [MB]    Clinical Course User Index [MB] Terrilee Files, MD                                 Medical Decision Making Amount and/or Complexity of Data Reviewed Labs: ordered. Radiology: ordered.  Risk Prescription drug management. Decision regarding hospitalization.   Medical Decision Making / ED Course   This patient presents to the ED for concern of altered mental status, respiratory distress, this involves an extensive number of treatment options, and is a complaint that carries with it a high risk of complications and morbidity.  The differential diagnosis includes pneumonia, ACS, PE, stroke, sepsis  MDM: 78 year old male presents today for concern of altered mental status, respiratory distress.  He is unable to provide history.  No clear history of baseline from facility.  Unable to get a hold of patient's daughter.  Admission considered but will reevaluate after labs and imaging.  Broad workup ordered including sepsis workup.  CBC with leukocytosis of 19.6.  Hemoglobin normal.  CMP with creatinine of 4.18 which is above his baseline.  Glucose of 433, and an anion gap of 26.  BNP elevated at 458.  Troponin elevated at 189.  Initial lactic acid above 7.  VBG without acute concerns.  Due to sepsis protocol patient was initially given large volume fluid  resuscitation.  He  was later found to sound wet and fluids were discontinued.  X-ray at least on my interpretation showed fluid overload.  Patient still with soft blood pressure.  He was transitioned to Levophed.  ICU was consulted.  Patient admitted to ICU for further workup and eval.  Blood pressure stable on Levophed.   Lab Tests: -I ordered, reviewed, and interpreted labs.   The pertinent results include:   Labs Reviewed  CBG MONITORING, ED - Abnormal; Notable for the following components:      Result Value   Glucose-Capillary 370 (*)    All other components within normal limits  I-STAT CG4 LACTIC ACID, ED - Abnormal; Notable for the following components:   Lactic Acid, Venous 7.3 (*)    All other components within normal limits  I-STAT VENOUS BLOOD GAS, ED - Abnormal; Notable for the following components:   pCO2, Ven 31.5 (*)    pO2, Ven 30 (*)    Bicarbonate 14.1 (*)    TCO2 15 (*)    Acid-base deficit 12.0 (*)    Sodium 127 (*)    Potassium >8.5 (*)    Calcium, Ion 0.98 (*)    All other components within normal limits  RESP PANEL BY RT-PCR (RSV, FLU A&B, COVID)  RVPGX2  CULTURE, BLOOD (ROUTINE X 2)  CULTURE, BLOOD (ROUTINE X 2)  ACETAMINOPHEN LEVEL  AMMONIA  ETHANOL  MAGNESIUM  SALICYLATE LEVEL  COMPREHENSIVE METABOLIC PANEL  CBC WITH DIFFERENTIAL/PLATELET  PROTIME-INR  APTT  LIPASE, BLOOD  BRAIN NATRIURETIC PEPTIDE  URINALYSIS, W/ REFLEX TO CULTURE (INFECTION SUSPECTED)  I-STAT CHEM 8, ED  TROPONIN I (HIGH SENSITIVITY)      EKG  EKG Interpretation Date/Time:  Wednesday February 11 2024 11:43:16 EST Ventricular Rate:  153 PR Interval:  152 QRS Duration:  129 QT Interval:  319 QTC Calculation: 509 R Axis:   250  Text Interpretation: Wide-QRS tachycardia Right bundle branch block Baseline wander in lead(s) I II aVR rate faster than prior 10/21 Confirmed by Meridee Score [phone]) on 02/11/2024 11:54:53 AM         Imaging Studies ordered: I ordered  imaging studies including chest x-ray, CT head, CT chest abdomen pelvis without contrast ordered but not resulted at the time of admission. I independently visualized and interpreted imaging. I agree with the radiologist interpretation   Medicines ordered and prescription drug management: Meds ordered this encounter  Medications   lactated ringers infusion   AND Linked Order Group    lactated ringers bolus 1,000 mL     Enter Patient Weight in Kilograms:   100    lactated ringers bolus 1,000 mL     Enter Patient Weight in Kilograms:   100    lactated ringers bolus 1,000 mL    lactated ringers bolus 500 mL     Enter Patient Weight in Kilograms:   100   ceFEPIme (MAXIPIME) 2 g in sodium chloride 0.9 % 100 mL IVPB    Antibiotic Indication::   Other Indication (list below)    Other Indication::   Unknown Source.   metroNIDAZOLE (FLAGYL) IVPB 500 mg    Antibiotic Indication::   Other Indication (list below)    Other Indication::   Unknown Source.   DISCONTD: vancomycin (VANCOCIN) IVPB 1000 mg/200 mL premix    Indication::   Other Indication (list below)    Other Indication::   Unknown Source.   vancomycin (VANCOREADY) IVPB 1500 mg/300 mL    Indication::   Sepsis    Other  Indication::   Unknown Source.    -I have reviewed the patients home medicines and have made adjustments as needed  Critical interventions Pressors, sepsis protocol, ICU consult  Consultations Obtained: I requested consultation with the ICU,  and discussed lab and imaging findings as well as pertinent plan - they recommend: As above   Cardiac Monitoring: The patient was maintained on a cardiac monitor.  I personally viewed and interpreted the cardiac monitored which showed an underlying rhythm of: A-fib with RVR  Social Determinants of Health:  Factors impacting patients care include: Lives at a facility   Reevaluation: After the interventions noted above, I reevaluated the patient and found that they have  :stayed the same  Co morbidities that complicate the patient evaluation  Past Medical History:  Diagnosis Date   Allergy    Diabetes mellitus without complication (HCC)    Hyperlipidemia    Refused pneumococcal vaccine 04/03/2018   Refuses all vaccinations   Urine incontinence       Dispostion: Discussed with ICU.  They will evaluate patient for admission.   Final Clinical Impression(s) / ED Diagnoses Final diagnoses:  None    Rx / DC Orders ED Discharge Orders     None         Marita Kansas, PA-C 02/11/24 1501    Terrilee Files, MD 02/11/24 [phone]

## 2024-02-11 NOTE — Progress Notes (Signed)
 Elink is following code sepsis.

## 2024-02-12 ENCOUNTER — Inpatient Hospital Stay (HOSPITAL_COMMUNITY)

## 2024-02-12 DIAGNOSIS — R4182 Altered mental status, unspecified: Secondary | ICD-10-CM | POA: Diagnosis not present

## 2024-02-12 DIAGNOSIS — A419 Sepsis, unspecified organism: Secondary | ICD-10-CM | POA: Diagnosis not present

## 2024-02-12 DIAGNOSIS — I5021 Acute systolic (congestive) heart failure: Secondary | ICD-10-CM | POA: Diagnosis not present

## 2024-02-12 DIAGNOSIS — G9340 Encephalopathy, unspecified: Secondary | ICD-10-CM | POA: Insufficient documentation

## 2024-02-12 DIAGNOSIS — B9561 Methicillin susceptible Staphylococcus aureus infection as the cause of diseases classified elsewhere: Secondary | ICD-10-CM | POA: Diagnosis not present

## 2024-02-12 DIAGNOSIS — E1165 Type 2 diabetes mellitus with hyperglycemia: Secondary | ICD-10-CM

## 2024-02-12 DIAGNOSIS — R6521 Severe sepsis with septic shock: Secondary | ICD-10-CM

## 2024-02-12 DIAGNOSIS — R918 Other nonspecific abnormal finding of lung field: Secondary | ICD-10-CM

## 2024-02-12 DIAGNOSIS — R569 Unspecified convulsions: Secondary | ICD-10-CM | POA: Diagnosis not present

## 2024-02-12 DIAGNOSIS — E872 Acidosis, unspecified: Secondary | ICD-10-CM

## 2024-02-12 LAB — ECHOCARDIOGRAM COMPLETE
AR max vel: 2.56 cm2
AV Area VTI: 2.95 cm2
AV Area mean vel: 2.45 cm2
AV Mean grad: 5 mmHg
AV Peak grad: 9 mmHg
Ao pk vel: 1.5 m/s
Area-P 1/2: 3.79 cm2
Height: 71 in
MV VTI: 3.13 cm2
S' Lateral: 2.5 cm
Single Plane A4C EF: 71.4 %
Weight: 3089.97 [oz_av]

## 2024-02-12 LAB — BLOOD CULTURE ID PANEL (REFLEXED) - BCID2

## 2024-02-12 LAB — BASIC METABOLIC PANEL
Anion gap: 17 — ABNORMAL HIGH (ref 5–15)
Anion gap: 25 — ABNORMAL HIGH (ref 5–15)
BUN: 75 mg/dL — ABNORMAL HIGH (ref 8–23)
BUN: 80 mg/dL — ABNORMAL HIGH (ref 8–23)
CO2: 15 mmol/L — ABNORMAL LOW (ref 22–32)
CO2: 12 mmol/L — ABNORMAL LOW (ref 22–32)
Calcium: 9.4 mg/dL (ref 8.9–10.3)
Calcium: 8.8 mg/dL — ABNORMAL LOW (ref 8.9–10.3)
Chloride: 106 mmol/L (ref 98–111)
Chloride: 98 mmol/L (ref 98–111)
Creatinine, Ser: 4.5 mg/dL — ABNORMAL HIGH (ref 0.61–1.24)
Creatinine, Ser: 5.11 mg/dL — ABNORMAL HIGH (ref 0.61–1.24)
GFR, Estimated: 13 mL/min — ABNORMAL LOW (ref >60)
GFR, Estimated: 11 mL/min — ABNORMAL LOW (ref >60)
Glucose, Bld: 125 mg/dL — ABNORMAL HIGH (ref 70–99)
Glucose, Bld: 370 mg/dL — ABNORMAL HIGH (ref 70–99)
Potassium: 4 mmol/L (ref 3.5–5.1)
Potassium: 5.1 mmol/L (ref 3.5–5.1)
Sodium: 138 mmol/L (ref 135–145)
Sodium: 135 mmol/L (ref 135–145)

## 2024-02-12 LAB — CBC
HCT: 37.6 % — ABNORMAL LOW (ref 39.0–52.0)
Hemoglobin: 12.5 g/dL — ABNORMAL LOW (ref 13.0–17.0)
MCH: 30.3 pg (ref 26.0–34.0)
MCHC: 33.2 g/dL (ref 30.0–36.0)
MCV: 91.3 fL (ref 80.0–100.0)
Platelets: 178 10*3/uL (ref 150–400)
RBC: 4.12 MIL/uL — ABNORMAL LOW (ref 4.22–5.81)
RDW: 14 % (ref 11.5–15.5)
WBC: 10.5 10*3/uL (ref 4.0–10.5)
nRBC: 0 % (ref 0.0–0.2)

## 2024-02-12 LAB — APTT: aPTT: 135 s — ABNORMAL HIGH (ref 24–36)

## 2024-02-12 LAB — GLUCOSE, CAPILLARY
Glucose-Capillary: 104 mg/dL — ABNORMAL HIGH (ref 70–99)
Glucose-Capillary: 110 mg/dL — ABNORMAL HIGH (ref 70–99)
Glucose-Capillary: 119 mg/dL — ABNORMAL HIGH (ref 70–99)
Glucose-Capillary: 122 mg/dL — ABNORMAL HIGH (ref 70–99)
Glucose-Capillary: 173 mg/dL — ABNORMAL HIGH (ref 70–99)
Glucose-Capillary: 86 mg/dL (ref 70–99)

## 2024-02-12 LAB — HEPARIN LEVEL (UNFRACTIONATED): Heparin Unfractionated: >1.1 [IU]/mL — ABNORMAL HIGH (ref 0.30–0.70)

## 2024-02-12 LAB — PHOSPHORUS: Phosphorus: 3.3 mg/dL (ref 2.5–4.6)

## 2024-02-12 LAB — MAGNESIUM: Magnesium: 1.6 mg/dL — ABNORMAL LOW (ref 1.7–2.4)

## 2024-02-12 MED ORDER — ACETAMINOPHEN 650 MG RE SUPP
650.0000 mg | Freq: Once | RECTAL | Status: AC
  Administered 2024-02-12: 650 mg via RECTAL
  Filled 2024-02-12: qty 1

## 2024-02-12 MED ORDER — SODIUM BICARBONATE 8.4 % IV SOLN
INTRAVENOUS | Status: DC
  Filled 2024-02-12 (×2): qty 1000

## 2024-02-12 MED ORDER — PERFLUTREN LIPID MICROSPHERE
1.0000 mL | INTRAVENOUS | Status: AC | PRN
  Administered 2024-02-12: 4 mL via INTRAVENOUS

## 2024-02-12 MED ORDER — LACTATED RINGERS IV BOLUS
1000.0000 mL | Freq: Once | INTRAVENOUS | Status: AC
  Administered 2024-02-12: 1000 mL via INTRAVENOUS

## 2024-02-12 MED ORDER — CHLORHEXIDINE GLUCONATE CLOTH 2 % EX PADS
6.0000 | MEDICATED_PAD | Freq: Every day | CUTANEOUS | Status: DC
  Administered 2024-02-12: 6 via TOPICAL

## 2024-02-12 MED ORDER — LACTATED RINGERS IV BOLUS
1000.0000 mL | Freq: Once | INTRAVENOUS | Status: AC
Start: 2024-02-12 — End: 2024-02-12
  Administered 2024-02-12: 1000 mL via INTRAVENOUS

## 2024-02-12 MED ORDER — CEFAZOLIN SODIUM-DEXTROSE 2-4 GM/100ML-% IV SOLN
2.0000 g | Freq: Two times a day (BID) | INTRAVENOUS | Status: DC
  Administered 2024-02-12 (×2): 2 g via INTRAVENOUS
  Filled 2024-02-12 (×2): qty 100

## 2024-02-12 MED ORDER — SODIUM CHLORIDE 0.9 % IV SOLN: INTRAVENOUS | Status: DC | PRN

## 2024-02-12 MED ORDER — ALBUMIN HUMAN 5 % IV SOLN
25.0000 g | Freq: Once | INTRAVENOUS | Status: AC
  Administered 2024-02-12: 25 g via INTRAVENOUS
  Filled 2024-02-12: qty 500

## 2024-02-12 MED ORDER — MAGNESIUM SULFATE IN D5W 1-5 GM/100ML-% IV SOLN
1.0000 g | Freq: Once | INTRAVENOUS | Status: AC
  Administered 2024-02-12: 1 g via INTRAVENOUS
  Filled 2024-02-12: qty 100

## 2024-02-12 MED ORDER — HEPARIN (PORCINE) 25000 UT/250ML-% IV SOLN
1200.0000 [IU]/h | INTRAVENOUS | Status: DC
  Administered 2024-02-12: 1350 [IU]/h via INTRAVENOUS
  Administered 2024-02-13: 1200 [IU]/h via INTRAVENOUS
  Filled 2024-02-12 (×2): qty 250

## 2024-02-12 NOTE — Progress Notes (Signed)
 eLink Physician-Brief Progress Note Patient Name: Jason Yoder DOB: 09-03-1946 MRN: [id number]   Date of Service  02/12/2024  HPI/Events of Note  Mg 1.6  eICU Interventions  replaced     Intervention Category Intermediate Interventions: Electrolyte abnormality - evaluation and management  Henry Russel, P 02/12/2024, 6:03 AM

## 2024-02-12 NOTE — Progress Notes (Signed)
 Confirmed with Elink, keep Amiodarone at 60

## 2024-02-12 NOTE — Plan of Care (Signed)
  Problem: Fluid Volume: Goal: Ability to maintain a balanced intake and output will improve Outcome: Not Progressing   Problem: Nutritional: Goal: Maintenance of adequate nutrition will improve Outcome: Not Progressing   Problem: Clinical Measurements: Goal: Will remain free from infection Outcome: Not Progressing Goal: Diagnostic test results will improve Outcome: Not Progressing Goal: Cardiovascular complication will be avoided Outcome: Not Progressing   Problem: Activity: Goal: Risk for activity intolerance will decrease Outcome: Not Progressing

## 2024-02-12 NOTE — Progress Notes (Signed)
 RT attempted to place left radial aline unable to advance catheter.

## 2024-02-12 NOTE — Progress Notes (Signed)
  Echocardiogram 2D Echocardiogram has been performed.  Ocie Doyne RDCS 02/12/2024, 12:18 PM

## 2024-02-12 NOTE — Procedures (Signed)
 Patient Name: Jason Yoder  MRN: [id number]  Epilepsy Attending: Charlsie Quest  Referring Physician/Provider: Lanier Clam, NP  Date: 02/12/2024 Duration: 25.24 mins  Patient history: 78yo M with ams. EEG to evaluate for seizure  Level of alertness: Awake/ lethargic   AEDs during EEG study: None  Technical aspects: This EEG study was done with scalp electrodes positioned according to the 10-20 International system of electrode placement. Electrical activity was reviewed with band pass filter of 1-70Hz , sensitivity of 7 uV/mm, display speed of 68mm/sec with a 60Hz  notched filter applied as appropriate. EEG data were recorded continuously and digitally stored.  Video monitoring was available and reviewed as appropriate.  Description: EEG showed continuous generalized 3 to 6 Hz theta-delta slowing. Hyperventilation and photic stimulation were not performed.    Patient was noted to have chewing artifact frequently throughout the EEG.   ABNORMALITY - Continuous slow, generalized  IMPRESSION: This study is suggestive of moderate diffuse encephalopathy. No seizures or epileptiform discharges were seen throughout the recording.  Wadie Mattie Annabelle Harman

## 2024-02-12 NOTE — Plan of Care (Signed)
  Problem: Education: Goal: Ability to describe self-care measures that may prevent or decrease complications (Diabetes Survival Skills Education) will improve Outcome: Not Progressing Goal: Individualized Educational Video(s) Outcome: Not Progressing   Problem: Coping: Goal: Ability to adjust to condition or change in health will improve Outcome: Not Progressing   Problem: Fluid Volume: Goal: Ability to maintain a balanced intake and output will improve Outcome: Not Progressing   Problem: Health Behavior/Discharge Planning: Goal: Ability to identify and utilize available resources and services will improve Outcome: Not Progressing Goal: Ability to manage health-related needs will improve Outcome: Not Progressing   Problem: Metabolic: Goal: Ability to maintain appropriate glucose levels will improve Outcome: Not Progressing   

## 2024-02-12 NOTE — Consult Note (Signed)
 [facility]    Date of Admission:  02/11/2024     Total days of antibiotics 2               Reason for Consult: MSSA bacteremia   Referring Provider: Champ/auto consult Primary Care Provider: Roderick Pee, PA (Inactive)   ASSESSMENT:  [name] is a 78 year old Caucasian male with history of diabetes and atrial fibrillation admitted with altered mental status and found to have acute encephalopathy, atrial fibrillation with RVR, acute kidney injury and MSSA bacteremia.  Source of infection is unclear although does have nonspecific nodular opacities in bilateral lungs possibly representing septic emboli.  Respiratory panel negative.  TTE is pending.  Obtain blood cultures for clearance of bacteremia.  Continue current dose of cefazolin.  Therapeutic drug monitoring of renal function and liver function.  Continue with standard precautions with no additional isolation necessary.  Remaining medical and supportive care per PCCM.  PLAN:  Continue current dose of cefazolin. Obtain blood cultures for clearance of bacteremia. Await TTE results and likely will need TEE Continue standard precautions. Therapeutic drug monitoring of renal and hepatic function. Acute kidney injury and remaining medical and supportive care per PCCM.   Principal Problem:   Sepsis (HCC) Active Problems:   Acute encephalopathy   MSSA bacteremia    Chlorhexidine Gluconate Cloth  6 each Topical Daily   insulin aspart  0-15 Units Subcutaneous Q4H   midodrine  10 mg Oral TID WC     HPI: Jason Yoder is a 78 y.o. male previous history of type 2 diabetes, atrial fibrillation on Eliquis, chronic kidney disease stage III, and previous CVA presenting from eye care facility with altered mental status and Kussmaul breathing.  Jason Yoder is a resident of a skilled nursing facility who was following up for routine eye care when he was found to have altered mental status and abnormal  breathing.  Facility noted weakness x 1 week with episodes of vomiting and malaise.  Initially afebrile with increased temperature shortly thereafter of 101 F and white blood cell count of 10,500.  Chest x-ray with patchy airspace opacities concerning for multifocal pneumonia or possible metastatic disease.  CT chest/abdomen/pelvis with nonspecific findings of irregular nodular opacities throughout both lungs favoring infectious or inflammatory processes; small right pleural effusion; and cholelithiasis without evidence for acute cholecystitis.  CT head with no acute findings.  Found to have lactic acidosis which improved with fluid resuscitation.  Blood work with acute kidney injury with creatinine 4.5 and estimated GFR of 13.  Started on broad-spectrum antibiotic coverage with vancomycin, cefepime, and metronidazole.  Blood cultures turned positive for MSSA and was narrowed to cefazolin.  TTE ordered and pending.  Respiratory panel negative for flu, RSV, and COVID-19.   Review of Systems: Review of Systems  Unable to perform ROS: Mental status change     Past Medical History:  Diagnosis Date   Allergy    Diabetes mellitus without complication (HCC)    Hyperlipidemia    Refused pneumococcal vaccine 04/03/2018   Refuses all vaccinations   Urine incontinence     Social History   Tobacco Use   Smoking status: Former    Current packs/day: 0.00    Average packs/day: 0.5 packs/day for 40.0 years (20.0 ttl pk-yrs)    Types: Cigarettes    Start date: 12/09/1972    Quit date: 12/09/2012    Years since quitting: 11.1   Smokeless tobacco: Former  Quit date: 12/10/1968  Vaping Use   Vaping status: Never Used  Substance Use Topics   Alcohol use: Never   Drug use: Never    No family history on file.  Allergies  Allergen Reactions   Vancomycin Rash    OBJECTIVE: Blood pressure (!) 76/60, pulse 97, temperature (!) 100.9 F (38.3 C), temperature source Axillary, resp. rate (!) 28, height  5\' 11"  (1.803 m), weight 87.6 kg, SpO2 97%.  Physical Exam Constitutional:      General: He is sleeping. He is not in acute distress.    Appearance: He is well-developed. He is ill-appearing.  Cardiovascular:     Rate and Rhythm: Regular rhythm. Tachycardia present.     Heart sounds: Normal heart sounds.  Pulmonary:     Effort: Pulmonary effort is normal. Tachypnea present.     Breath sounds: Normal breath sounds.  Skin:    General: Skin is warm and dry.  Neurological:     Mental Status: He is oriented to person, place, and time.  Psychiatric:        Behavior: Behavior normal.        Thought Content: Thought content normal.        Judgment: Judgment normal.     Lab Results Lab Results  Component Value Date   WBC 10.5 02/12/2024   HGB 12.5 (L) 02/12/2024   HCT 37.6 (L) 02/12/2024   MCV 91.3 02/12/2024   PLT 178 02/12/2024    Lab Results  Component Value Date   CREATININE 4.50 (H) 02/12/2024   BUN 75 (H) 02/12/2024   NA 138 02/12/2024   K 4.0 02/12/2024   CL 106 02/12/2024   CO2 15 (L) 02/12/2024    Lab Results  Component Value Date   ALT 32 02/11/2024   AST 71 (H) 02/11/2024   ALKPHOS 39 02/11/2024   BILITOT 1.5 (H) 02/11/2024     Microbiology: Recent Results (from the past 240 hours)  Resp panel by RT-PCR (RSV, Flu A&B, Covid) Anterior Nasal Swab     Status: None   Collection Time: 02/11/24 11:53 AM   Specimen: Anterior Nasal Swab  Result Value Ref Range Status   SARS Coronavirus 2 by RT PCR NEGATIVE NEGATIVE Final   Influenza A by PCR NEGATIVE NEGATIVE Final   Influenza B by PCR NEGATIVE NEGATIVE Final    Comment: (NOTE) The Xpert Xpress SARS-CoV-2/FLU/RSV plus assay is intended as an aid in the diagnosis of influenza from Nasopharyngeal swab specimens and should not be used as a sole basis for treatment. Nasal washings and aspirates are unacceptable for Xpert Xpress SARS-CoV-2/FLU/RSV testing.  Fact Sheet for  Patients: BloggerCourse.com  Fact Sheet for Healthcare Providers: SeriousBroker.it  This test is not yet approved or cleared by the [place] FDA and has been authorized for detection and/or diagnosis of SARS-CoV-2 by FDA under an Emergency Use Authorization (EUA). This EUA will remain in effect (meaning this test can be used) for the duration of the COVID-19 declaration under Section 564(b)(1) of the Act, 21 U.S.C. section 360bbb-3(b)(1), unless the authorization is terminated or revoked.     Resp Syncytial Virus by PCR NEGATIVE NEGATIVE Final    Comment: (NOTE) Fact Sheet for Patients: BloggerCourse.com  Fact Sheet for Healthcare Providers: SeriousBroker.it  This test is not yet approved or cleared by the [place] FDA and has been authorized for detection and/or diagnosis of SARS-CoV-2 by FDA under an Emergency Use Authorization (EUA). This EUA will remain in effect (meaning this test  can be used) for the duration of the COVID-19 declaration under Section 564(b)(1) of the Act, 21 U.S.C. section 360bbb-3(b)(1), unless the authorization is terminated or revoked.  Performed at [facility], [address], [place], [place] [address]   Blood Culture (routine x 2)     Status: Abnormal (Preliminary result)   Collection Time: 02/11/24 11:53 AM   Specimen: BLOOD  Result Value Ref Range Status   Specimen Description BLOOD SITE NOT SPECIFIED  Final   Special Requests   Final    BOTTLES DRAWN AEROBIC AND ANAEROBIC Blood Culture results may not be optimal due to an inadequate volume of blood received in culture bottles   Culture  Setup Time   Final    GRAM POSITIVE COCCI IN CLUSTERS IN BOTH AEROBIC AND ANAEROBIC BOTTLES CRITICAL VALUE NOTED.  VALUE IS CONSISTENT WITH PREVIOUSLY REPORTED AND CALLED VALUE. Performed at [facility], [address], [place],  [place] [address]    Culture STAPHYLOCOCCUS AUREUS (A)  Final   Report Status PENDING  Incomplete  Blood Culture (routine x 2)     Status: Abnormal (Preliminary result)   Collection Time: 02/11/24 11:58 AM   Specimen: BLOOD  Result Value Ref Range Status   Specimen Description BLOOD SITE NOT SPECIFIED  Final   Special Requests   Final    BOTTLES DRAWN AEROBIC AND ANAEROBIC Blood Culture results may not be optimal due to an inadequate volume of blood received in culture bottles   Culture  Setup Time   Final    GRAM POSITIVE COCCI IN CLUSTERS IN BOTH AEROBIC AND ANAEROBIC BOTTLES CRITICAL RESULT CALLED TO, READ BACK BY AND VERIFIED WITH: PHARMD J LEDFORD 02/12/2024 @ 0235 BY AB    Culture (A)  Final    STAPHYLOCOCCUS AUREUS CULTURE REINCUBATED FOR BETTER GROWTH Performed at [facility], [address] [place], [place] [address]    Report Status PENDING  Incomplete  Blood Culture ID Panel (Reflexed)     Status: Abnormal   Collection Time: 02/11/24 11:58 AM  Result Value Ref Range Status   Enterococcus faecalis NOT DETECTED NOT DETECTED Final   Enterococcus Faecium NOT DETECTED NOT DETECTED Final   Listeria monocytogenes NOT DETECTED NOT DETECTED Final   Staphylococcus species DETECTED (A) NOT DETECTED Final    Comment: CRITICAL RESULT CALLED TO, READ BACK BY AND VERIFIED WITH: PHARMD J LEDFORD 02/12/2024 @ 0235 BY AB    Staphylococcus aureus (BCID) DETECTED (A) NOT DETECTED Final    Comment: CRITICAL RESULT CALLED TO, READ BACK BY AND VERIFIED WITH: PHARMD J LEDFORD 02/12/2024 @ 0235 BY AB    Staphylococcus epidermidis NOT DETECTED NOT DETECTED Final   Staphylococcus lugdunensis NOT DETECTED NOT DETECTED Final   Streptococcus species NOT DETECTED NOT DETECTED Final   Streptococcus agalactiae NOT DETECTED NOT DETECTED Final   Streptococcus pneumoniae NOT DETECTED NOT DETECTED Final   Streptococcus pyogenes NOT DETECTED NOT DETECTED Final   A.calcoaceticus-baumannii NOT DETECTED NOT  DETECTED Final   Bacteroides fragilis NOT DETECTED NOT DETECTED Final   Enterobacterales NOT DETECTED NOT DETECTED Final   Enterobacter cloacae complex NOT DETECTED NOT DETECTED Final   Escherichia coli NOT DETECTED NOT DETECTED Final   Klebsiella aerogenes NOT DETECTED NOT DETECTED Final   Klebsiella oxytoca NOT DETECTED NOT DETECTED Final   Klebsiella pneumoniae NOT DETECTED NOT DETECTED Final   Proteus species NOT DETECTED NOT DETECTED Final   Salmonella species NOT DETECTED NOT DETECTED Final   Serratia marcescens  NOT DETECTED NOT DETECTED Final   Haemophilus influenzae NOT DETECTED NOT DETECTED Final   Neisseria meningitidis NOT DETECTED NOT DETECTED Final   Pseudomonas aeruginosa NOT DETECTED NOT DETECTED Final   Stenotrophomonas maltophilia NOT DETECTED NOT DETECTED Final   Candida albicans NOT DETECTED NOT DETECTED Final   Candida auris NOT DETECTED NOT DETECTED Final   Candida glabrata NOT DETECTED NOT DETECTED Final   Candida krusei NOT DETECTED NOT DETECTED Final   Candida parapsilosis NOT DETECTED NOT DETECTED Final   Candida tropicalis NOT DETECTED NOT DETECTED Final   Cryptococcus neoformans/gattii NOT DETECTED NOT DETECTED Final   Meth resistant mecA/C and MREJ NOT DETECTED NOT DETECTED Final    Comment: Performed at [facility], [address] [place], [place] [address]  MRSA Next Gen by PCR, Nasal     Status: None   Collection Time: 02/11/24  6:45 PM   Specimen: Nasal Mucosa; Nasal Swab  Result Value Ref Range Status   MRSA by PCR Next Gen NOT DETECTED NOT DETECTED Final    Comment: (NOTE) The GeneXpert MRSA Assay (FDA approved for NASAL specimens only), is one component of a comprehensive MRSA colonization surveillance program. It is not intended to diagnose MRSA infection nor to guide or monitor treatment for MRSA infections. Test performance is not FDA approved in patients less than 75 years old. Performed at [facility], [address]  [place], [place] [address]      Marcos Eke, NP [facility]  02/12/2024  1:09 PM

## 2024-02-12 NOTE — Progress Notes (Signed)
 PHARMACY - PHYSICIAN COMMUNICATION CRITICAL VALUE ALERT - BLOOD CULTURE IDENTIFICATION (BCID)  Jason Yoder is an 78 y.o. male who presented to [facility] on 02/11/2024 with a chief complaint of encephalopathy    Name of physician (or Provider) Contacted: Dr. Katrinka Blazing  Current antibiotics: Ceftriaxone  Changes to prescribed antibiotics recommended:  DC Ceftriaxone  Start Cefazolin 2g IV q12h  Results for orders placed or performed during the hospital encounter of 02/11/24  Blood Culture ID Panel (Reflexed) (Collected: 02/11/2024 11:58 AM)  Result Value Ref Range   Enterococcus faecalis NOT DETECTED NOT DETECTED   Enterococcus Faecium NOT DETECTED NOT DETECTED   Listeria monocytogenes NOT DETECTED NOT DETECTED   Staphylococcus species DETECTED (A) NOT DETECTED   Staphylococcus aureus (BCID) DETECTED (A) NOT DETECTED   Staphylococcus epidermidis NOT DETECTED NOT DETECTED   Staphylococcus lugdunensis NOT DETECTED NOT DETECTED   Streptococcus species NOT DETECTED NOT DETECTED   Streptococcus agalactiae NOT DETECTED NOT DETECTED   Streptococcus pneumoniae NOT DETECTED NOT DETECTED   Streptococcus pyogenes NOT DETECTED NOT DETECTED   A.calcoaceticus-baumannii NOT DETECTED NOT DETECTED   Bacteroides fragilis NOT DETECTED NOT DETECTED   Enterobacterales NOT DETECTED NOT DETECTED   Enterobacter cloacae complex NOT DETECTED NOT DETECTED   Escherichia coli NOT DETECTED NOT DETECTED   Klebsiella aerogenes NOT DETECTED NOT DETECTED   Klebsiella oxytoca NOT DETECTED NOT DETECTED   Klebsiella pneumoniae NOT DETECTED NOT DETECTED   Proteus species NOT DETECTED NOT DETECTED   Salmonella species NOT DETECTED NOT DETECTED   Serratia marcescens NOT DETECTED NOT DETECTED   Haemophilus influenzae NOT DETECTED NOT DETECTED   Neisseria meningitidis NOT DETECTED NOT DETECTED   Pseudomonas aeruginosa NOT DETECTED NOT DETECTED   Stenotrophomonas maltophilia NOT DETECTED NOT DETECTED   Candida albicans NOT  DETECTED NOT DETECTED   Candida auris NOT DETECTED NOT DETECTED   Candida glabrata NOT DETECTED NOT DETECTED   Candida krusei NOT DETECTED NOT DETECTED   Candida parapsilosis NOT DETECTED NOT DETECTED   Candida tropicalis NOT DETECTED NOT DETECTED   Cryptococcus neoformans/gattii NOT DETECTED NOT DETECTED   Meth resistant mecA/C and MREJ NOT DETECTED NOT DETECTED    [name] 02/12/2024  4:50 AM

## 2024-02-12 NOTE — Progress Notes (Signed)
 EEG complete - results pending

## 2024-02-12 NOTE — Progress Notes (Signed)
 PHARMACY - ANTICOAGULATION CONSULT NOTE  Pharmacy Consult for Heparin  Indication: atrial fibrillation  Allergies  Allergen Reactions   Vancomycin Rash    Patient Measurements: Height: 5\' 11"  (180.3 cm) Weight: 87.6 kg (193 lb 2 oz) IBW/kg (Calculated) : 75.3 Heparin Dosing Weight: 87.6 kg   Vital Signs: Temp: 101 F (38.3 C) (03/06 0728) Temp Source: Axillary (03/06 0728) BP: 86/51 (03/06 0937) Pulse Rate: 126 (03/06 0937)  Labs: Recent Labs    02/11/24 1145 02/11/24 1219 02/11/24 1353 02/11/24 1434 02/11/24 1808 02/11/24 1810 02/12/24 0349  HGB 14.2 16.3  --   --  13.1  --  12.5*  HCT 44.4 48.0  --   --  40.5  --  37.6*  PLT 190  --   --   --  165  --  178  APTT  --   --   --  43*  --   --   --   LABPROT  --   --   --  24.3*  --   --   --   INR  --   --   --  2.2*  --   --   --   CREATININE 4.18*  --   --   --   --  4.06* 4.50*  TROPONINIHS 289*  --  189*  --   --   --   --     Estimated Creatinine Clearance: 14.9 mL/min (A) (by C-G formula based on SCr of 4.5 mg/dL (H)).   Medical History: Past Medical History:  Diagnosis Date   Allergy    Diabetes mellitus without complication (HCC)    Hyperlipidemia    Refused pneumococcal vaccine 04/03/2018   Refuses all vaccinations   Urine incontinence     Medications:  Medications Prior to Admission  Medication Sig Dispense Refill Last Dose/Taking   acetaminophen (TYLENOL) 500 MG tablet Take 500 mg by mouth 2 (two) times daily.   02/11/2024 Morning   atorvastatin (LIPITOR) 80 MG tablet Take 80 mg by mouth at bedtime.   02/08/2024   Cholecalciferol 25 MCG (1000 UT) tablet Take 2,000 Units by mouth daily.   02/11/2024 Morning   clotrimazole-betamethasone (LOTRISONE) cream Apply 1 inch topically twice daily as needed for rash   Taking   cyanocobalamin 1000 MCG tablet Take 1,000 mcg by mouth daily.   02/10/2024   diclofenac Sodium (VOLTAREN) 1 % GEL Apply 2 g topically 2 (two) times daily. Both feet and right knee    02/11/2024 Morning   ELIQUIS 5 MG TABS tablet Take 5 mg by mouth 2 (two) times daily.   02/11/2024 at  9:00 AM   fenofibrate (TRICOR) 145 MG tablet Take 145 mg by mouth at bedtime.   02/08/2024   Lactobacillus Acid-Pectin (ACIDOPHILUS/PECTIN) CAPS Take 1 capsule by mouth daily.   02/11/2024 Morning   loratadine (CLARITIN) 10 MG tablet Take 10 mg by mouth at bedtime.   02/08/2024   magnesium hydroxide (MILK OF MAGNESIA) 400 MG/5ML suspension Take 60 mLs by mouth daily as needed for heartburn or mild constipation.   Taking As Needed   melatonin 3 MG TABS tablet Take 6 mg by mouth at bedtime.   02/08/2024   metFORMIN (GLUCOPHAGE) 850 MG tablet Take 850 mg by mouth 2 (two) times daily with a meal.   02/10/2024   metoprolol tartrate (LOPRESSOR) 25 MG tablet Take 12.5 mg by mouth daily. Hold if systolic BP less than 110   02/10/2024   midodrine (PROAMATINE)  10 MG tablet Take 10 mg by mouth 3 (three) times daily. Do not give if SBP greater than 130   02/11/2024 Morning   Omega-3 1000 MG CAPS Take 1 capsule by mouth daily.   02/10/2024   omeprazole (PRILOSEC) 20 MG capsule Take 20 mg by mouth daily.   02/10/2024   ondansetron (ZOFRAN) 4 MG tablet Take 8 mg by mouth every 8 (eight) hours as needed for nausea or vomiting.   02/09/2024   polyethylene glycol powder (GLYCOLAX/MIRALAX) 17 GM/SCOOP powder Take 17 g by mouth daily.   02/10/2024   polyvinyl alcohol (LIQUIFILM TEARS) 1.4 % ophthalmic solution Place 2 drops into both eyes 2 (two) times daily as needed (allergy).   Taking As Needed   pregabalin (LYRICA) 25 MG capsule Take 25 mg by mouth at bedtime.   02/08/2024   senna-docusate (SENOKOT-S) 8.6-50 MG tablet Take 2 tablets by mouth at bedtime as needed for moderate constipation.   Taking As Needed   sertraline (ZOLOFT) 50 MG tablet Take 75 mg by mouth daily.   02/11/2024 Morning   White Petrolatum-Mineral Oil (REFRESH LACRI-LUBE) OINT Place 1 application  into both eyes at bedtime.   Taking   zinc gluconate 50 MG tablet Take 50 mg  by mouth daily.   02/11/2024 Morning   bacitracin 500 UNIT/GM ointment Apply 1 Application topically 2 (two) times daily. To scalp abrasions for 2 weeks. (Patient not taking: Reported on 02/12/2024)   Not Taking   Blood Glucose Monitoring Suppl DEVI Use as directed twice daily      Camphor-Eucalyptus-Menthol (VICKS VAPORUB) 4.7-1.2-2.6 % OINT Apply 1 application  topically 3 (three) times daily as needed (congestion). (Patient not taking: Reported on 02/12/2024)   Not Taking   Glucose Blood (BLOOD GLUCOSE TEST STRIPS) STRP Use as instructed      KETOCONAZOLE, TOPICAL, 1 % SHAM Apply 1 application  topically See admin instructions. Twice a week, Monday and Thursday for 2 months. (Patient not taking: Reported on 02/12/2024)   Not Taking   Lancets MISC Use as directed.      Scheduled:   Chlorhexidine Gluconate Cloth  6 each Topical Daily   heparin injection (subcutaneous)  5,000 Units Subcutaneous Q8H   insulin aspart  0-15 Units Subcutaneous Q4H   midodrine  10 mg Oral TID WC   Infusions:   sodium chloride     sodium chloride 100 mL/hr at 02/12/24 1000   amiodarone 30 mg/hr (02/12/24 1000)    ceFAZolin (ANCEF) IV Stopped (02/12/24 0544)   lactated ringers     norepinephrine (LEVOPHED) Adult infusion 2 mcg/min (02/12/24 1000)   PRN: docusate sodium, polyethylene glycol  Assessment: Patient admitted with MSSA bacteremia. Pending TTE. On Eliquis PTA for Afib. Last dose 3/5 09:00. Sub-Q heparin given at 05:00 this AM.   Hb 12.5, plt 178.   Goal of Therapy:  Heparin level 0.3-0.7 units/ml aPTT 66-102 seconds Monitor platelets by anticoagulation protocol: Yes   Plan:  Start heparin infusion at 1350 units/hr Check anti-Xa level and aPTT in 8 hours and daily while on heparin until correlating  Continue to monitor H&H and platelets  Cedric Fishman 02/12/2024,10:48 AM

## 2024-02-12 NOTE — IPAL (Signed)
  Interdisciplinary Goals of Care Family Meeting   Date carried out: 02/12/2024  Location of the meeting: Phone conference  Member's involved: Physician, Nurse Practitioner, and Family Member or next of kin  Durable Power of Attorney or acting medical decision maker: Jason Yoder     Discussion: We discussed goals of care for Jason Yoder .  I updated Jason Yoder on Jason Yoder's critical illness -- septic shock from MSSA bacteremia, with multisystem organ failure.  We talked about current plan of care.  We also talked about Code status/ GOC -- She affirms that Jason Yoder is DNR. We also specifically talked about intubation in  non-arrest situations -- he would not want to be intubated.   I encouraged her to think about what Jason Yoder would want should his kidneys fail and dialysis were to be considered.   We also talked about the possibility that Jason Yoder might need a central line (discussed the procedure but did not obtain consent since we are not at a point to move forward)   Plan -change code status to DNR/I -cont periph pressors, if we get to a point of considering CVC, need to obtain consent  -Jason Yoder will continue to think about other specific goals of care   Code status:   Code Status: Limited: Do not attempt resuscitation (DNR) -DNR-LIMITED -Do Not Intubate/DNI    Disposition: Continue current acute care  Time spent for the meeting: 13 minutes     Lanier Clam, NP  02/12/2024, 1:32 PM

## 2024-02-12 NOTE — Progress Notes (Signed)
 NAME:  Jason Yoder, MRN:  [id number], DOB:  06-08-1946, LOS: 1 ADMISSION DATE:  02/11/2024, CONSULTATION DATE:  02/11/24 REFERRING MD:  A. Karie Mainland -- EM PA, CHIEF COMPLAINT:  AMS   History of Present Illness:  78 yo M PMH  arthritis with limited mobility, HLD, pAF on eliquis & intolerant of toprol 2/2 hypotension, CKD 3, prior stroke, chronic hypotension on midodrine, RBBB  who is a nursing home resident arriving with nursing home paperwork indicating DNR code status (does not arrive with MOST form however) who presented to ED 3/5 from his ophthalmologist office where he was felt to be altered and have an abnormal breathing pattern  Hx from SNF was that he as been weak x 1 week, a few episodes of vomiting, and generally feeling unwell. In ED, rcvd 1 L IVF. Found to be in afib RVR. SBP < 100 so was started on peripheral pressors.  PCCM called in this setting  At time of consultation, imaging is pending.  Prelim labs w bicarb 11, Cr 4.18, BUN 70, Glu 433. Mag 1.6. BNP 460, trop 290,   WBC 19  I STAT LA is 7.3  Looks like this was also with an iSTAT VBG sample which seems inaccurate, noting an istat K > 8.5 but serum 4.6  He is quite altered and not able to provide much history. I have tried calling his daughter but have not been able to successfully connect   Pertinent  Medical History  CKD 3 Hypotension CAD CVA TBI Arthritis DNR  pAF Chronic anticoagulation   Significant Hospital Events: Including procedures, antibiotic start and stop dates in addition to other pertinent events   3/5 ED from eye appointment w [facility] and abnormal resp pattern. Started on periph pressors  3/6 MMSA bacteremia, abx changed to ancef. More encephalopathic   Interim History / Subjective:   2 NE for bulk of night  Bcx MSSA  Objective   Blood pressure 97/67, pulse (!) 135, temperature (!) 101 F (38.3 C), temperature source Axillary, resp. rate (!) 37, height 5\' 11"  (1.803 m), weight 87.6 kg, SpO2 96%.         Intake/Output Summary (Last 24 hours) at 02/12/2024 0943 Last data filed at 02/12/2024 0700 Gross per 24 hour  Intake 6183.06 ml  Output 70 ml  Net 6113.06 ml   Filed Weights   02/12/24 0103  Weight: 87.6 kg    Examination: General: Ill appearing older adult M  HENT: NCAT. Oral thrush  Lungs: Unlabored, no accessory use. sounds obscured by moaning and grunting  Cardiovascular: tachycardic. Cap refill is brisk.  Abdomen: diffusely tender, soft  Extremities: Chronic arthritic changes  Neuro: Awake, completely disoriented, not following commands. Moves spontaneously, grunting and moaning.  GU: foley, scant UOP  Resolved Hospital Problem list     Assessment & Plan:   Acute encephalopathy  -r/t sepsis, renal failure/uremia P -sepsis, AKI as below -minimize CNS depressing meds  -Sips with meds, if he is able   Septic shock  MSSA bacteremia  Lactic acidosis  P -ancef -ID consult, anticipate they will put in for repeat cx  -TTE is ordered, pending this might end up needing TEE  -cont periph pressors -- would be great to avoid central access as long as possible  -cont IVF  -PRN LA  Nodular pulmonary opacities  -? Septic emboli Small pleural effusions -nodules could be infectious v inflammatory v malignancy  -- with bacteremia, concern that this could be sequelae of septic emboli.  P -abx as above -TTE as above  -will need follow up imaging  AKI on CKD 3  Metabolic acidosis  Hypomagnesemia  P -cont foley -add'l L IVF -replace mag   Afib on eliquis Hx RBBB Chronic hypotension on midodrine  P -amio -hep gtt  -TID midodrine if he can take POs. NE as above   DM with hyperglycemia -SSI   GOC  DNR  -DNR noted in his nursing home paperwork  -have tried to reach daughter, no success  -really need to connect to est GOC -- worse encephalopathy and renal failure 3/6, concern being that if his mentation declines further he may lose airway protection.  Best  Practice (right click and "Reselect all SmartList Selections" daily)   Diet/type: NPO w/ oral meds DVT prophylaxis systemic heparin Pressure ulcer(s): pressure ulcer assessment deferred  GI prophylaxis: N/A Lines: N/A Foley:   Code Status:  DNR Last date of multidisciplinary goals of care discussion [--]  Labs   CBC: Recent Labs  Lab 02/11/24 1145 02/11/24 1219 02/11/24 1808 02/12/24 0349  WBC 19.6*  --  8.6 10.5  NEUTROABS 17.1*  --   --   --   HGB 14.2 16.3 13.1 12.5*  HCT 44.4 48.0 40.5 37.6*  MCV 95.1  --  93.1 91.3  PLT 190  --  165 178    Basic Metabolic Panel: Recent Labs  Lab 02/11/24 1145 02/11/24 1219 02/11/24 1810 02/12/24 0349  NA 135 127*  --  138  K 4.6 >8.5*  --  4.0  CL 98  --   --  106  CO2 11*  --   --  15*  GLUCOSE 433*  --   --  125*  BUN 70*  --   --  75*  CREATININE 4.18*  --  4.06* 4.50*  CALCIUM 10.2  --   --  9.4  MG 1.6*  --   --  1.6*  PHOS  --   --   --  3.3   GFR: Estimated Creatinine Clearance: 14.9 mL/min (A) (by C-G formula based on SCr of 4.5 mg/dL (H)). Recent Labs  Lab 02/11/24 1145 02/11/24 1219 02/11/24 1406 02/11/24 1808 02/11/24 1810 02/12/24 0349  WBC 19.6*  --   --  8.6  --  10.5  LATICACIDVEN  --  7.3* 3.9*  --  5.5*  --     Liver Function Tests: Recent Labs  Lab 02/11/24 1145  AST 71*  ALT 32  ALKPHOS 39  BILITOT 1.5*  PROT 7.5  ALBUMIN 2.9*   Recent Labs  Lab 02/11/24 1145  LIPASE 13   Recent Labs  Lab 02/11/24 1810  AMMONIA 24    ABG    Component Value Date/Time   HCO3 14.1 (L) 02/11/2024 1219   TCO2 15 (L) 02/11/2024 1219   ACIDBASEDEF 12.0 (H) 02/11/2024 1219   O2SAT 48 02/11/2024 1219     Coagulation Profile: Recent Labs  Lab 02/11/24 1434  INR 2.2*    Cardiac Enzymes: No results for input(s): "CKTOTAL", "CKMB", "CKMBINDEX", "TROPONINI" in the last 168 hours.  HbA1C: Hemoglobin A1C  Date/Time Value Ref Range Status  09/04/2018 09:50 AM 10.0 (A) 4.0 - 5.6 % Corrected   06/08/2018 08:50 AM 9.4 (A) 4.0 - 5.6 % Final   Hgb A1c MFr Bld  Date/Time Value Ref Range Status  02/11/2024 06:10 PM 8.8 (H) 4.8 - 5.6 % Final    Comment:    (NOTE) Pre diabetes:  5.7%-6.4%  Diabetes:              >6.4%  Glycemic control for   <7.0% adults with diabetes     CBG: Recent Labs  Lab 02/11/24 1702 02/11/24 1931 02/12/24 0002 02/12/24 0358 02/12/24 0726  GLUCAP 313* 255* 173* 122* 104*    CRITICAL CARE Performed by: Lanier Clam   Total critical care time: 39 minutes  Critical care time was exclusive of separately billable procedures and treating other patients. Critical care was necessary to treat or prevent imminent or life-threatening deterioration.  Critical care was time spent personally by me on the following activities: development of treatment plan with patient and/or surrogate as well as nursing, discussions with consultants, evaluation of patient's response to treatment, examination of patient, obtaining history from patient or surrogate, ordering and performing treatments and interventions, ordering and review of laboratory studies, ordering and review of radiographic studies, pulse oximetry and re-evaluation of patient's condition.  Tessie Fass MSN, AGACNP-BC [facility] Pulmonary/Critical Care Medicine Amion for pager 02/12/2024, 9:43 AM

## 2024-02-13 DIAGNOSIS — R6521 Severe sepsis with septic shock: Secondary | ICD-10-CM | POA: Diagnosis not present

## 2024-02-13 DIAGNOSIS — A419 Sepsis, unspecified organism: Secondary | ICD-10-CM | POA: Diagnosis not present

## 2024-02-13 DIAGNOSIS — B9561 Methicillin susceptible Staphylococcus aureus infection as the cause of diseases classified elsewhere: Secondary | ICD-10-CM

## 2024-02-13 DIAGNOSIS — J9601 Acute respiratory failure with hypoxia: Secondary | ICD-10-CM

## 2024-02-13 DIAGNOSIS — G9340 Encephalopathy, unspecified: Secondary | ICD-10-CM | POA: Diagnosis not present

## 2024-02-13 DIAGNOSIS — I959 Hypotension, unspecified: Secondary | ICD-10-CM

## 2024-02-13 DIAGNOSIS — J90 Pleural effusion, not elsewhere classified: Secondary | ICD-10-CM

## 2024-02-13 LAB — GLUCOSE, CAPILLARY
Glucose-Capillary: 153 mg/dL — ABNORMAL HIGH (ref 70–99)
Glucose-Capillary: 158 mg/dL — ABNORMAL HIGH (ref 70–99)
Glucose-Capillary: 173 mg/dL — ABNORMAL HIGH (ref 70–99)
Glucose-Capillary: 191 mg/dL — ABNORMAL HIGH (ref 70–99)

## 2024-02-13 MED ORDER — GLYCOPYRROLATE 0.2 MG/ML IJ SOLN: 0.2000 mg | INTRAMUSCULAR | Status: DC | PRN

## 2024-02-13 MED ORDER — MIDAZOLAM HCL 2 MG/2ML IJ SOLN: 2.0000 mg | INTRAMUSCULAR | Status: DC | PRN

## 2024-02-13 MED ORDER — MIDODRINE HCL 5 MG PO TABS: 10.0000 mg | ORAL_TABLET | Freq: Three times a day (TID) | ORAL | Status: DC

## 2024-02-13 MED ORDER — GLYCOPYRROLATE 1 MG PO TABS: 1.0000 mg | ORAL_TABLET | ORAL | Status: DC | PRN

## 2024-02-13 MED ORDER — ACETAMINOPHEN 650 MG RE SUPP: 650.0000 mg | Freq: Four times a day (QID) | RECTAL | Status: DC | PRN

## 2024-02-13 MED ORDER — ACETAMINOPHEN 650 MG RE SUPP
650.0000 mg | Freq: Three times a day (TID) | RECTAL | Status: DC | PRN
  Administered 2024-02-13: 650 mg via RECTAL
  Filled 2024-02-13: qty 1

## 2024-02-13 MED ORDER — POLYVINYL ALCOHOL 1.4 % OP SOLN: 1.0000 [drp] | Freq: Four times a day (QID) | OPHTHALMIC | Status: DC | PRN

## 2024-02-13 MED ORDER — MORPHINE 100MG IN NS 100ML (1MG/ML) PREMIX INFUSION
0.0000 mg/h | INTRAVENOUS | Status: DC
  Administered 2024-02-13: 5 mg/h via INTRAVENOUS
  Filled 2024-02-13: qty 100

## 2024-02-13 MED ORDER — MORPHINE BOLUS VIA INFUSION: 5.0000 mg | INTRAVENOUS | Status: DC | PRN

## 2024-02-13 MED ORDER — ACETAMINOPHEN 325 MG PO TABS: 650.0000 mg | ORAL_TABLET | Freq: Four times a day (QID) | ORAL | Status: DC | PRN

## 2024-02-15 LAB — CULTURE, BLOOD (ROUTINE X 2)

## 2024-02-17 LAB — CULTURE, BLOOD (ROUTINE X 2)
Culture: NO GROWTH
Culture: NO GROWTH

## 2024-03-09 NOTE — Progress Notes (Signed)
  Time of Death: Patient was pronounced dead on _Mar 25, 2025 at 08:49    On exam, no heart sounds or breath sounds were noted after 1 minute of auscultation. Pupils were fixed and dilated without pupillary light reflex.   Attending, NP. Tessie Fass was notified.   Death packet has been completed.  The organ donor network was notified and the case was declined.

## 2024-03-09 NOTE — Progress Notes (Signed)
 eLink Physician-Brief Progress Note Patient Name: Jason Yoder DOB: Feb 27, 1946 MRN: [id number]   Date of Service  03/01/2024  HPI/Events of Note  Vasopressor needs increasing.   Nurse unable to reach next of kin for cvc consent  eICU Interventions  Increase peripheral levophed dose until reach next of kin to discuss cvc      Intervention Category Major Interventions: Hypotension - evaluation and management  Henry Russel, P 02/20/2024, 4:27 AM

## 2024-03-09 NOTE — Death Summary Note (Addendum)
 DEATH SUMMARY   Patient Details  Name: Jason Yoder MRN: [id number] DOB: 02-28-1946  Admission/Discharge Information   Admit Date:  03/10/2024  Date of Death: Date of Death: 2024/03/12  Time of Death: Time of Death: 0849  Length of Stay: 2  Referring Physician: Roderick Pee, PA (Inactive)   Reason(s) for Hospitalization  Altered mental status   Diagnoses  Preliminary cause of death: MSSA bacteremia  Secondary Diagnoses (including complications and co-morbidities):  MSSA bacteremia Septic shock Lactic acidosis Metabolic acidosis AKI on CKD 3 Acute encephalopathy Acute respiratory failure with hypoxia Nodular pulmonary opacities  Bilateral pleural effusions Atrial fibrillation with RVR Chronic anticoagulation  Chronic hypotension RBBB DM2 with hyperglycemia DNR  Goals of care discussion Encounter for palliative care    Brief Hospital Course (including significant findings, care, treatment, and services provided and events leading to death)  Jason Yoder is a 78 y.o. year old male nursing home resident who was admitted to the ICU 2024/03/10 after presenting to the ED with AMS. He was referred to ED from his eye doctor, who noted he was altered. He was hypotensive in the ED and started on pressors and broad spectrum antibiotics. A CT H did not have acute findings and CT c/a/p were not revealing for cause of shock. He did have a notable AKI superimposed on his CKD. Ultimately, his Bcx revealed MSSA.    ID was consulted 3/6 and his antibiotics were narrowed to ancef. His renal function and mentation continued to decline. DNR/I was verified by family. He was started on a bicarb infusion with his worsening acidosis and renal function.   03-12-24 he had a dramatic increase in pressor requirement. Renal function continued to deteriorate. Mentation continued to decline and he was no longer safely protecting his airway, in this DNR/I gentleman. Goals of care were discussed with family and  the patient was transitioned to comfort care.  He died peacefully Mar 12, 2024 at 8:49  Family was notified thereafter     Pertinent Labs and Studies  Significant Diagnostic Studies US RENAL Result Date: 03-12-24 CLINICAL DATA:  [id number].  Acute kidney injury. EXAM: RENAL / URINARY TRACT ULTRASOUND COMPLETE COMPARISON:  Chest, abdomen and pelvis CT without contrast yesterday FINDINGS: Right Kidney: Renal measurements: 10.3 x 4.8 x 4.4 cm = volume: 112.68 mL. Echogenicity within normal limits. No mass, stones or hydronephrosis visualized. There is a 3.1 cm anechoic simple cyst again noted in the posterior upper to mid pole of this kidney. Left Kidney: Renal measurements: 12.8 x 6.3 x 4.3 cm = volume: 180.86 mL. Echogenicity within normal limits. No mass, stones or hydronephrosis visualized. Bladder: Contracted and obscured by bowel gas. Other: None. IMPRESSION: 1. No significant sonographic findings involving either kidney. 2. 3.1 cm simple cyst in the right kidney. 3. Bladder contracted and obscured by bowel gas. Electronically Signed   By: Almira Bar M.D.   On: 2024/03/12 02:51   EEG adult Result Date: 02/12/2024 Charlsie Quest, MD     02/12/2024  4:42 PM Patient Name: Jason Yoder MRN: [id number] Epilepsy Attending: Charlsie Quest Referring Physician/Provider: Lanier Clam, NP Date: 02/12/2024 Duration: 25.24 mins Patient history: 78yo M with ams. EEG to evaluate for seizure Level of alertness: Awake/ lethargic AEDs during EEG study: None Technical aspects: This EEG study was done with scalp electrodes positioned according to the 10-20 International system of electrode placement. Electrical activity was reviewed with band pass filter of 1-70Hz , sensitivity of 7 uV/mm, display speed of 37mm/sec  with a 60Hz  notched filter applied as appropriate. EEG data were recorded continuously and digitally stored.  Video monitoring was available and reviewed as appropriate. Description: EEG showed continuous  generalized 3 to 6 Hz theta-delta slowing. Hyperventilation and photic stimulation were not performed.  Patient was noted to have chewing artifact frequently throughout the EEG. ABNORMALITY - Continuous slow, generalized IMPRESSION: This study is suggestive of moderate diffuse encephalopathy. No seizures or epileptiform discharges were seen throughout the recording. Charlsie Quest   ECHOCARDIOGRAM COMPLETE Result Date: 02/12/2024    ECHOCARDIOGRAM REPORT   Patient Name:   Jason Yoder Date of Exam: 02/12/2024 Medical Rec #:  [id number]     Height:       71.0 in Accession #:    [id number]    Weight:       193.1 lb Date of Birth:  1946/02/10    BSA:          2.077 m Patient Age:    77 years      BP:           86/51 mmHg Patient Gender: M             HR:           79 bpm. Exam Location:  Inpatient Procedure: 2D Echo, Cardiac Doppler, Color Doppler and Intracardiac            Opacification Agent (Both Spectral and Color Flow Doppler were            utilized during procedure). Indications:    CHF-acute systolic  History:        Patient has no prior history of Echocardiogram examinations.                 Risk Factors:Dyslipidemia and Diabetes.  Sonographer:    Vern Claude Referring Phys: [id number] Jaana Brodt E Jordain Radin  Sonographer Comments: Image acquisition challenging due to patient body habitus and Image acquisition challenging due to respiratory motion. IMPRESSIONS  1. Very poor acoustic windows throughout study, even with Definity Overall LVEF appears normal Cannot fully evaluate regional wall motion. Endocardium is not seen well enough to do this.  2. Right ventricular systolic function is normal. The right ventricular size is normal.  3. Trivial mitral valve regurgitation.  4. AV is thickened, calciefied Difficult to see leaflets due to poor acoustic windows. MEan gradient through the valve is low at 5 mm Hg. Marland Kitchen Aortic valve regurgitation is not visualized.  5. The inferior vena cava is normal in size with greater than  50% respiratory variability, suggesting right atrial pressure of 3 mmHg. FINDINGS  Left Ventricle: Very poor acoustic windows throughout study, even with Definity Overall LVEF appears normal Cannot fully evaluate regional wall motion. Endocardium is not seen well enough to do this. The left ventricular internal cavity size was normal in size. There is no left ventricular hypertrophy. Right Ventricle: The right ventricular size is normal. Right vetricular wall thickness was not assessed. Right ventricular systolic function is normal. Left Atrium: Left atrial size was normal in size. Right Atrium: Right atrial size was normal in size. Pericardium: There is no evidence of pericardial effusion. Mitral Valve: There is mild thickening of the mitral valve leaflet(s). There is mild calcification of the mitral valve leaflet(s). Trivial mitral valve regurgitation. MV peak gradient, 1.8 mmHg. The mean mitral valve gradient is 1.0 mmHg. Tricuspid Valve: The tricuspid valve is grossly normal. Tricuspid valve regurgitation is trivial. Aortic Valve: AV is thickened, calciefied Difficult to  see leaflets due to poor acoustic windows. MEan gradient through the valve is low at 5 mm Hg. Aortic valve regurgitation is not visualized. Aortic valve mean gradient measures 5.0 mmHg. Aortic valve peak gradient measures 9.0 mmHg. Aortic valve area, by VTI measures 2.95 cm. Pulmonic Valve: The pulmonic valve was not well visualized. Aorta: The aortic root and ascending aorta are structurally normal, with no evidence of dilitation. Venous: The inferior vena cava is normal in size with greater than 50% respiratory variability, suggesting right atrial pressure of 3 mmHg. IAS/Shunts: No atrial level shunt detected by color flow Doppler.  LEFT VENTRICLE PLAX 2D LVIDd:         5.50 cm     Diastology LVIDs:         2.50 cm     LV e' medial:    5.11 cm/s LV PW:         1.00 cm     LV E/e' medial:  8.6 LV IVS:        0.80 cm     LV e' lateral:   4.57  cm/s LVOT diam:     2.10 cm     LV E/e' lateral: 9.6 LV SV:         52 LV SV Index:   25 LVOT Area:     3.46 cm  LV Volumes (MOD) LV vol d, MOD A4C: 44.1 ml LV vol s, MOD A4C: 12.6 ml LV SV MOD A4C:     44.1 ml RIGHT VENTRICLE             IVC RV Basal diam:  3.20 cm     IVC diam: 1.20 cm RV Mid diam:    2.00 cm RV S prime:     11.70 cm/s LEFT ATRIUM             Index        RIGHT ATRIUM          Index LA diam:        3.10 cm 1.49 cm/m   RA Area:     9.62 cm LA Vol (A2C):   49.5 ml 23.83 ml/m  RA Volume:   17.00 ml 8.18 ml/m LA Vol (A4C):   36.8 ml 17.71 ml/m LA Biplane Vol: 46.7 ml 22.48 ml/m  AORTIC VALVE AV Area (Vmax):    2.56 cm AV Area (Vmean):   2.45 cm AV Area (VTI):     2.95 cm AV Vmax:           150.00 cm/s AV Vmean:          105.000 cm/s AV VTI:            0.177 m AV Peak Grad:      9.0 mmHg AV Mean Grad:      5.0 mmHg LVOT Vmax:         111.00 cm/s LVOT Vmean:        74.400 cm/s LVOT VTI:          0.151 m LVOT/AV VTI ratio: 0.85  AORTA Ao Root diam: 2.90 cm Ao Asc diam:  3.00 cm MITRAL VALVE MV Area (PHT): 3.79 cm    SHUNTS MV Area VTI:   3.13 cm    Systemic VTI:  0.15 m MV Peak grad:  1.8 mmHg    Systemic Diam: 2.10 cm MV Mean grad:  1.0 mmHg MV Vmax:       0.66 m/s MV Vmean:  44.4 cm/s MV Decel Time: 200 msec MV E velocity: 44.00 cm/s MV A velocity: 65.10 cm/s MV E/A ratio:  0.68 Dietrich Pates MD Electronically signed by Dietrich Pates MD Signature Date/Time: 02/12/2024/4:10:19 PM    Final    CT CHEST ABDOMEN PELVIS WO CONTRAST Result Date: 02/11/2024 CLINICAL DATA:  Sepsis.  Mental status change. EXAM: CT CHEST, ABDOMEN AND PELVIS WITHOUT CONTRAST TECHNIQUE: Multidetector CT imaging of the chest, abdomen and pelvis was performed following the standard protocol without IV contrast. RADIATION DOSE REDUCTION: This exam was performed according to the departmental dose-optimization program which includes automated exposure control, adjustment of the mA and/or kV according to patient size and/or use  of iterative reconstruction technique. COMPARISON:  Chest CT 11/14/2020 FINDINGS: CT CHEST FINDINGS Cardiovascular: Heart size is normal. No significant pericardial effusion. Coronary artery calcifications. Atherosclerotic calcifications in thoracic aorta without aneurysm. Mediastinum/Nodes: Thyroid tissue is unremarkable.Low-density material near the aortic arch probably represents the superior pericardial recess on image 18/3. No gross abnormality to the esophagus. No significant chest lymphadenopathy. Lungs/Pleura: Small amount of right pleural fluid. Trace left pleural fluid. Trachea and mainstem bronchi are patent. Scattered irregular nodular opacities throughout both lungs. Index opacity measures 2.4 cm in the left upper lobe on image 70/5. Index opacity in the left lower lobe measures 4.1 cm on image 97/5. Some of the opacities are peripheral. There is some consolidation in the posterior right lower lobe. Motion artifact in the lungs limits evaluation. Musculoskeletal: No acute bone abnormality. Asymmetric breast tissue, right side greater than left. Findings may represent gynecomastia. CT ABDOMEN PELVIS FINDINGS Hepatobiliary: Mild gallbladder distension with multiple gallstones. No inflammatory changes around the gallbladder. No gross abnormality involving the liver. Pancreas: Unremarkable. No pancreatic ductal dilatation or surrounding inflammatory changes. Spleen: Small splenic calcifications could be related to old granulomatous disease. Spleen size is normal. Adrenals/Urinary Tract: Normal adrenal glands. Low-density cyst in the posterior right kidney. Punctate right kidney stone without hydronephrosis. Negative for left hydronephrosis. No suspicious renal lesion. Normal appearance of the urinary bladder. Stomach/Bowel: Normal stomach. No bowel dilatation or obstruction. No focal bowel inflammation. Moderate amount of stool in the rectum. Vascular/Lymphatic: Aortic atherosclerosis. No enlarged abdominal  or pelvic lymph nodes. Small lymph nodes in the porta hepatis and precaval region are similar to the exam in 2021. Reproductive: Prostate is unremarkable. Other: Right inguinal hernia containing fat. Negative for free fluid. Negative for free air. Musculoskeletal: Irregularity involving the bilateral iliac wings appears chronic. Left hip arthroplasty is located. No acute bone abnormality. IMPRESSION: 1. Scattered irregular nodular opacities throughout both lungs. Findings are nonspecific but favor an infectious or inflammatory process. Neoplasm cannot be excluded and recommend follow-up to ensure resolution. 2. Small right pleural effusion. Trace left pleural fluid. 3. Cholelithiasis without evidence for acute cholecystitis. 4. Right kidney stone without hydronephrosis. 5. Right inguinal hernia containing fat. 6. Asymmetric breast tissue, right side greater than left. Findings may represent gynecomastia. Recommend correlation with physical exam. 7. Aortic Atherosclerosis (ICD10-I70.0). Electronically Signed   By: Richarda Overlie M.D.   On: 02/11/2024 17:18   DG Chest Port 1 View Result Date: 02/11/2024 CLINICAL DATA:  Question sepsis. Altered mental status. Tachycardia. EXAM: PORTABLE CHEST 1 VIEW COMPARISON:  CT of the chest 11/14/2020. FINDINGS: The heart is size is upper limits of normal. Patchy airspace opacities are present bilaterally. Small effusions are suspected. The visualized soft tissues and bony thorax are unremarkable. IMPRESSION: 1. Patchy airspace opacities bilaterally concerning for multifocal pneumonia. Metastatic disease is also considered. 2.  Small effusions. Electronically Signed   By: Marin Roberts M.D.   On: 02/11/2024 15:55   CT HEAD WO CONTRAST ( ) Result Date: 02/11/2024 CLINICAL DATA:  Mental status change of unknown cause EXAM: CT HEAD WITHOUT CONTRAST TECHNIQUE: Contiguous axial images were obtained from the base of the skull through the vertex without intravenous contrast.  RADIATION DOSE REDUCTION: This exam was performed according to the departmental dose-optimization program which includes automated exposure control, adjustment of the mA and/or kV according to patient size and/or use of iterative reconstruction technique. COMPARISON:  None Available. FINDINGS: Brain: Age related volume loss. Cerebral hemispheres show mild chronic small-vessel change of the white matter. 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 No large vessel territory stroke, mass, hemorrhage, hydrocephalus or extra-axial collection. Vascular: There is atherosclerotic calcification of the major vessels at the base of the brain. Skull: Negative Sinuses/Orbits: Clear/normal Other: None IMPRESSION: No acute CT finding. Age related volume loss. Mild chronic small-vessel change of the cerebral hemispheric white matter. 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 Electronically Signed   By: Paulina Fusi M.D.   On: 02/11/2024 14:48    Microbiology Recent Results (from the past 240 hours)  Resp panel by RT-PCR (RSV, Flu A&B, Covid) Anterior Nasal Swab     Status: None   Collection Time: 02/11/24 11:53 AM   Specimen: Anterior Nasal Swab  Result Value Ref Range Status   SARS Coronavirus 2 by RT PCR NEGATIVE NEGATIVE Final   Influenza A by PCR NEGATIVE NEGATIVE Final   Influenza B by PCR NEGATIVE NEGATIVE Final    Comment: (NOTE) The Xpert Xpress SARS-CoV-2/FLU/RSV plus assay is intended as an aid in the diagnosis of influenza from Nasopharyngeal swab specimens and should not be used as a sole basis for treatment. Nasal washings and aspirates are unacceptable for Xpert Xpress SARS-CoV-2/FLU/RSV testing.  Fact Sheet for Patients: BloggerCourse.com  Fact Sheet for Healthcare Providers: SeriousBroker.it  This test is not yet approved or cleared by the [place] FDA and has been authorized for detection and/or diagnosis of SARS-CoV-2  by FDA under an Emergency Use Authorization (EUA). This EUA will remain in effect (meaning this test can be used) for the duration of the COVID-19 declaration under Section 564(b)(1) of the Act, 21 U.S.C. section 360bbb-3(b)(1), unless the authorization is terminated or revoked.     Resp Syncytial Virus by PCR NEGATIVE NEGATIVE Final    Comment: (NOTE) Fact Sheet for Patients: BloggerCourse.com  Fact Sheet for Healthcare Providers: SeriousBroker.it  This test is not yet approved or cleared by the [place] FDA and has been authorized for detection and/or diagnosis of SARS-CoV-2 by FDA under an Emergency Use Authorization (EUA). This EUA will remain in effect (meaning this test can be used) for the duration of the COVID-19 declaration under Section 564(b)(1) of the Act, 21 U.S.C. section 360bbb-3(b)(1), unless the authorization is terminated or revoked.  Performed at [facility], [address] [place], [place] [address]   Blood Culture (routine x 2)     Status: Abnormal (Preliminary result)   Collection Time: 02/11/24 11:53 AM   Specimen: BLOOD  Result Value Ref Range Status   Specimen Description BLOOD SITE NOT SPECIFIED  Final   Special Requests   Final    BOTTLES DRAWN AEROBIC AND ANAEROBIC Blood Culture results may not be optimal due to an inadequate volume of blood received in culture bottles   Culture  Setup Time   Final    GRAM  POSITIVE COCCI IN CLUSTERS IN BOTH AEROBIC AND ANAEROBIC BOTTLES CRITICAL VALUE NOTED.  VALUE IS CONSISTENT WITH PREVIOUSLY REPORTED AND CALLED VALUE. Performed at [facility], [address] [place], [place] [address]    Culture STAPHYLOCOCCUS AUREUS (A)  Final   Report Status PENDING  Incomplete  Blood Culture (routine x 2)     Status: Abnormal (Preliminary result)   Collection Time: 02/11/24 11:58 AM   Specimen: BLOOD  Result Value Ref Range Status   Specimen  Description BLOOD SITE NOT SPECIFIED  Final   Special Requests   Final    BOTTLES DRAWN AEROBIC AND ANAEROBIC Blood Culture results may not be optimal due to an inadequate volume of blood received in culture bottles   Culture  Setup Time   Final    GRAM POSITIVE COCCI IN CLUSTERS IN BOTH AEROBIC AND ANAEROBIC BOTTLES CRITICAL RESULT CALLED TO, READ BACK BY AND VERIFIED WITH: PHARMD J LEDFORD 02/12/2024 @ 0235 BY AB    Culture (A)  Final    STAPHYLOCOCCUS AUREUS SUSCEPTIBILITIES TO FOLLOW Performed at [facility], [address], [place], [place] [address]    Report Status PENDING  Incomplete  Blood Culture ID Panel (Reflexed)     Status: Abnormal   Collection Time: 02/11/24 11:58 AM  Result Value Ref Range Status   Enterococcus faecalis NOT DETECTED NOT DETECTED Final   Enterococcus Faecium NOT DETECTED NOT DETECTED Final   Listeria monocytogenes NOT DETECTED NOT DETECTED Final   Staphylococcus species DETECTED (A) NOT DETECTED Final    Comment: CRITICAL RESULT CALLED TO, READ BACK BY AND VERIFIED WITH: PHARMD J LEDFORD 02/12/2024 @ 0235 BY AB    Staphylococcus aureus (BCID) DETECTED (A) NOT DETECTED Final    Comment: CRITICAL RESULT CALLED TO, READ BACK BY AND VERIFIED WITH: PHARMD J LEDFORD 02/12/2024 @ 0235 BY AB    Staphylococcus epidermidis NOT DETECTED NOT DETECTED Final   Staphylococcus lugdunensis NOT DETECTED NOT DETECTED Final   Streptococcus species NOT DETECTED NOT DETECTED Final   Streptococcus agalactiae NOT DETECTED NOT DETECTED Final   Streptococcus pneumoniae NOT DETECTED NOT DETECTED Final   Streptococcus pyogenes NOT DETECTED NOT DETECTED Final   A.calcoaceticus-baumannii NOT DETECTED NOT DETECTED Final   Bacteroides fragilis NOT DETECTED NOT DETECTED Final   Enterobacterales NOT DETECTED NOT DETECTED Final   Enterobacter cloacae complex NOT DETECTED NOT DETECTED Final   Escherichia coli NOT DETECTED NOT DETECTED Final   Klebsiella aerogenes NOT DETECTED  NOT DETECTED Final   Klebsiella oxytoca NOT DETECTED NOT DETECTED Final   Klebsiella pneumoniae NOT DETECTED NOT DETECTED Final   Proteus species NOT DETECTED NOT DETECTED Final   Salmonella species NOT DETECTED NOT DETECTED Final   Serratia marcescens NOT DETECTED NOT DETECTED Final   Haemophilus influenzae NOT DETECTED NOT DETECTED Final   Neisseria meningitidis NOT DETECTED NOT DETECTED Final   Pseudomonas aeruginosa NOT DETECTED NOT DETECTED Final   Stenotrophomonas maltophilia NOT DETECTED NOT DETECTED Final   Candida albicans NOT DETECTED NOT DETECTED Final   Candida auris NOT DETECTED NOT DETECTED Final   Candida glabrata NOT DETECTED NOT DETECTED Final   Candida krusei NOT DETECTED NOT DETECTED Final   Candida parapsilosis NOT DETECTED NOT DETECTED Final   Candida tropicalis NOT DETECTED NOT DETECTED Final   Cryptococcus neoformans/gattii NOT DETECTED NOT DETECTED Final   Meth resistant mecA/C and MREJ NOT DETECTED NOT DETECTED Final    Comment: Performed at [facility], [address] [place],  [place] [address]  MRSA Next Gen by PCR, Nasal     Status: None   Collection Time: 02/11/24  6:45 PM   Specimen: Nasal Mucosa; Nasal Swab  Result Value Ref Range Status   MRSA by PCR Next Gen NOT DETECTED NOT DETECTED Final    Comment: (NOTE) The GeneXpert MRSA Assay (FDA approved for NASAL specimens only), is one component of a comprehensive MRSA colonization surveillance program. It is not intended to diagnose MRSA infection nor to guide or monitor treatment for MRSA infections. Test performance is not FDA approved in patients less than 62 years old. Performed at [facility], [address], [place], [place] [address]     Lab Basic Metabolic Panel: Recent Labs  Lab 02/11/24 1145 02/11/24 1219 02/11/24 1810 02/12/24 0349 02/12/24 2019  NA 135 127*  --  138 135  K 4.6 >8.5*  --  4.0 5.1  CL 98  --   --  106 98  CO2 11*  --   --  15* 12*  GLUCOSE 433*  --    --  125* 370*  BUN 70*  --   --  75* 80*  CREATININE 4.18*  --  4.06* 4.50* 5.11*  CALCIUM 10.2  --   --  9.4 8.8*  MG 1.6*  --   --  1.6*  --   PHOS  --   --   --  3.3  --    Liver Function Tests: Recent Labs  Lab 02/11/24 1145  AST 71*  ALT 32  ALKPHOS 39  BILITOT 1.5*  PROT 7.5  ALBUMIN 2.9*   Recent Labs  Lab 02/11/24 1145  LIPASE 13   Recent Labs  Lab 02/11/24 1810  AMMONIA 24   CBC: Recent Labs  Lab 02/11/24 1145 02/11/24 1219 02/11/24 1808 02/12/24 0349  WBC 19.6*  --  8.6 10.5  NEUTROABS 17.1*  --   --   --   HGB 14.2 16.3 13.1 12.5*  HCT 44.4 48.0 40.5 37.6*  MCV 95.1  --  93.1 91.3  PLT 190  --  165 178   Cardiac Enzymes: No results for input(s): "CKTOTAL", "CKMB", "CKMBINDEX", "TROPONINI" in the last 168 hours. Sepsis Labs: Recent Labs  Lab 02/11/24 1145 02/11/24 1219 02/11/24 1406 02/11/24 1808 02/11/24 1810 02/12/24 0349  WBC 19.6*  --   --  8.6  --  10.5  LATICACIDVEN  --  7.3* 3.9*  --  5.5*  --     Procedures/Operations     Lanier Clam 03/03/2024, 11:42 AM

## 2024-03-09 NOTE — Progress Notes (Addendum)
 Attempted to contact patient's daughter to obtain central line consent but was unable to get through to them. Elink made aware. Attempted 2nd time to contact patients daughter.

## 2024-03-09 NOTE — Progress Notes (Signed)
 NAME:  Jason Yoder, MRN:  [id number], DOB:  02/28/1946, LOS: 2 ADMISSION DATE:  02/11/2024, CONSULTATION DATE:  02/11/24 REFERRING MD:  A. Karie Mainland -- EM PA, CHIEF COMPLAINT:  AMS   History of Present Illness:  78 yo M PMH  arthritis with limited mobility, HLD, pAF on eliquis & intolerant of toprol 2/2 hypotension, CKD 3, prior stroke, chronic hypotension on midodrine, RBBB  who is a nursing home resident arriving with nursing home paperwork indicating DNR code status (does not arrive with MOST form however) who presented to ED 3/5 from his ophthalmologist office where he was felt to be altered and have an abnormal breathing pattern  Hx from SNF was that he as been weak x 1 week, a few episodes of vomiting, and generally feeling unwell. In ED, rcvd 1 L IVF. Found to be in afib RVR. SBP < 100 so was started on peripheral pressors.  PCCM called in this setting  At time of consultation, imaging is pending.  Prelim labs w bicarb 11, Cr 4.18, BUN 70, Glu 433. Mag 1.6. BNP 460, trop 290,   WBC 19  I STAT LA is 7.3  Looks like this was also with an iSTAT VBG sample which seems inaccurate, noting an istat K > 8.5 but serum 4.6  He is quite altered and not able to provide much history. I have tried calling his daughter but have not been able to successfully connect   Pertinent  Medical History  CKD 3 Hypotension CAD CVA TBI Arthritis DNR  pAF Chronic anticoagulation   Significant Hospital Events: Including procedures, antibiotic start and stop dates in addition to other pertinent events   3/5 ED from eye appointment w AMS and abnormal resp pattern. Started on periph pressors  3/6 MMSA bacteremia, abx changed to ancef. More encephalopathic. Renal US without explanation for renal failure. Bicarb gtt started   3/7 decompensated further overnight. Spoke w daughter, transition to comfort care   Interim History / Subjective:  Up to 20 NE Worse renal failure and associated acidosis. He made 15 cc  urine 3/6.  Worsening hypoxia Remains grossly encephalopathic   Objective   Blood pressure 100/65, pulse (!) 113, temperature 98.6 F (37 C), temperature source Axillary, resp. rate (!) 25, height 5\' 11"  (1.803 m), weight 87.5 kg, SpO2 97%.        Intake/Output Summary (Last 24 hours) at 02/14/2024 0812 Last data filed at 02/23/2024 0601 Gross per 24 hour  Intake 6235.05 ml  Output 15 ml  Net 6220.05 ml   Filed Weights   02/12/24 0103 02/12/2024 0448  Weight: 87.6 kg 87.5 kg    Examination: General: Critically ill appearing older adult M  HENT: NCAT dry mm. Oral thrush  Lungs: Incr RR,. No accessory use  Cardiovascular: irir cap refill 3 seconds  Abdomen: soft tender Extremities:  no acute deformity  Neuro: Opens eyes to pain and grunts. Constant rhythmic chewing. 4mm pupils   GU: foley, minimal UOP   Resolved Hospital Problem list     Assessment & Plan:  Goals of Care  DNR Encounter for palliative care  Acute encephalopathy Septic shock MSSA bacteremia Lactic acidosis AKI on CKD3 with anuria, uremia   -he is net positive 12.5L, has made <100 cc urine this admission. Worsening acidosis. New hypoxia.  AGMA Lactic acidosis  Acute resp failure w hypoxia Nodular pulmonary opacities, c/f septic emboli Bilateral pleural effusions  Afib RVR, on chronic anticoagulation (eliquis)  Hx RBBB Chronic hypotension on midodrine  DM2 w hyperglycemia -Dan's pressor requirement has increased dramatically. His kidneys are failing. He is increasingly hypoxic and remains profoundly encephalopathic. He is not safely protecting his airway. -I spoke with Lyla Son 3/6 regarding code status and GOC -- DNR/I and we talked about possible transition to comfort should his status dramatically worsen -We spoke 3/7 in light of this decompensation and a compassionate decision was reached to transition to comfort focussed care.  P -DNR/I, comfort care  -morphine gtt PRN BZD  -dc labs, imaging  medications not aimed at comfort -will update Lyla Son when Jesusita Oka passes -- she lives many hours away and asked that we proceed with comfort measures now.   Best Practice (right click and "Reselect all SmartList Selections" daily)   Diet/type: NPO DVT prophylaxis not indicated Pressure ulcer(s): pressure ulcer assessment deferred  GI prophylaxis: N/A Lines: N/A Foley:  yes Code Status:  DNR Last date of multidisciplinary goals of care discussion [3/7]  Labs   CBC: Recent Labs  Lab 02/11/24 1145 02/11/24 1219 02/11/24 1808 02/12/24 0349  WBC 19.6*  --  8.6 10.5  NEUTROABS 17.1*  --   --   --   HGB 14.2 16.3 13.1 12.5*  HCT 44.4 48.0 40.5 37.6*  MCV 95.1  --  93.1 91.3  PLT 190  --  165 178    Basic Metabolic Panel: Recent Labs  Lab 02/11/24 1145 02/11/24 1219 02/11/24 1810 02/12/24 0349 02/12/24 2019  NA 135 127*  --  138 135  K 4.6 >8.5*  --  4.0 5.1  CL 98  --   --  106 98  CO2 11*  --   --  15* 12*  GLUCOSE 433*  --   --  125* 370*  BUN 70*  --   --  75* 80*  CREATININE 4.18*  --  4.06* 4.50* 5.11*  CALCIUM 10.2  --   --  9.4 8.8*  MG 1.6*  --   --  1.6*  --   PHOS  --   --   --  3.3  --    GFR: Estimated Creatinine Clearance: 13.1 mL/min (A) (by C-G formula based on SCr of 5.11 mg/dL (H)). Recent Labs  Lab 02/11/24 1145 02/11/24 1219 02/11/24 1406 02/11/24 1808 02/11/24 1810 02/12/24 0349  WBC 19.6*  --   --  8.6  --  10.5  LATICACIDVEN  --  7.3* 3.9*  --  5.5*  --     Liver Function Tests: Recent Labs  Lab 02/11/24 1145  AST 71*  ALT 32  ALKPHOS 39  BILITOT 1.5*  PROT 7.5  ALBUMIN 2.9*   Recent Labs  Lab 02/11/24 1145  LIPASE 13   Recent Labs  Lab 02/11/24 1810  AMMONIA 24    ABG    Component Value Date/Time   HCO3 14.1 (L) 02/11/2024 1219   TCO2 15 (L) 02/11/2024 1219   ACIDBASEDEF 12.0 (H) 02/11/2024 1219   O2SAT 48 02/11/2024 1219     Coagulation Profile: Recent Labs  Lab 02/11/24 1434  INR 2.2*    Cardiac  Enzymes: No results for input(s): "CKTOTAL", "CKMB", "CKMBINDEX", "TROPONINI" in the last 168 hours.  HbA1C: Hemoglobin A1C  Date/Time Value Ref Range Status  09/04/2018 09:50 AM 10.0 (A) 4.0 - 5.6 % Corrected  06/08/2018 08:50 AM 9.4 (A) 4.0 - 5.6 % Final   Hgb A1c MFr Bld  Date/Time Value Ref Range Status  02/11/2024 06:10 PM 8.8 (H) 4.8 - 5.6 % Final  Comment:    (NOTE) Pre diabetes:          5.7%-6.4%  Diabetes:              >6.4%  Glycemic control for   <7.0% adults with diabetes     CBG: Recent Labs  Lab 02/12/24 1939 02/17/2024 0005 02/23/2024 0038 02/19/2024 0357 03/04/2024 0726  GLUCAP 119* 191* 158* 173* 153*    CRITICAL CARE Performed by: Lanier Clam   Total critical care time: 38 minutes  Critical care time was exclusive of separately billable procedures and treating other patients. Critical care was necessary to treat or prevent imminent or life-threatening deterioration.  Critical care was time spent personally by me on the following activities: development of treatment plan with patient and/or surrogate as well as nursing, discussions with consultants, evaluation of patient's response to treatment, examination of patient, obtaining history from patient or surrogate, ordering and performing treatments and interventions, ordering and review of laboratory studies, ordering and review of radiographic studies, pulse oximetry and re-evaluation of patient's condition.  Tessie Fass MSN, AGACNP-BC [facility] Pulmonary/Critical Care Medicine Amion for pager 02/07/2024, 8:12 AM

## 2024-03-09 NOTE — Plan of Care (Signed)
  Problem: Pain Managment: Goal: General experience of comfort will improve and/or be controlled Outcome: Progressing   Problem: Safety: Goal: Ability to remain free from injury will improve Outcome: Progressing

## 2024-03-09 NOTE — Plan of Care (Signed)
 Patient has died. I spoke with Lyla Son and have informed her of time of death: 8:49

## 2024-03-09 NOTE — Progress Notes (Signed)
 PHARMACY - ANTICOAGULATION CONSULT NOTE  Pharmacy Consult for heparin Indication: atrial fibrillation  Labs: Recent Labs    02/11/24 1145 02/11/24 1219 02/11/24 1353 02/11/24 1434 02/11/24 1808 02/11/24 1810 02/12/24 0349 02/12/24 2019  HGB 14.2 16.3  --   --  13.1  --  12.5*  --   HCT 44.4 48.0  --   --  40.5  --  37.6*  --   PLT 190  --   --   --  165  --  178  --   APTT  --   --   --  43*  --   --   --  135*  LABPROT  --   --   --  24.3*  --   --   --   --   INR  --   --   --  2.2*  --   --   --   --   HEPARINUNFRC  --   --   --   --   --   --   --  >1.10*  CREATININE 4.18*  --   --   --   --  4.06* 4.50* 5.11*  TROPONINIHS 289*  --  189*  --   --   --   --   --    Assessment: 78yo male supratherapeutic on heparin with initial dosing while DOAC on hold; no infusion issues or signs of bleeding per RN.  Goal of Therapy:  aPTT 66-102 seconds   Plan:  Decrease heparin infusion by 2 units/kg/hr to 1200 units/hr. Check PTT in 8 hours.   Vernard Gambles, PharmD, BCPS 02/16/2024 3:42 AM

## 2024-03-09 DEATH — deceased
